# Patient Record
Sex: Female | Born: 1950 | Race: White | Hispanic: No | Marital: Married | Smoking: Former smoker
Health system: Southern US, Community
[De-identification: ages and names within clinical notes are randomized; demographics above are authoritative.]

## PROBLEM LIST (undated history)

## (undated) DIAGNOSIS — T7840XA Allergy, unspecified, initial encounter: Secondary | ICD-10-CM

## (undated) DIAGNOSIS — M8580 Other specified disorders of bone density and structure, unspecified site: Secondary | ICD-10-CM

## (undated) DIAGNOSIS — H269 Unspecified cataract: Secondary | ICD-10-CM

## (undated) DIAGNOSIS — M810 Age-related osteoporosis without current pathological fracture: Secondary | ICD-10-CM

## (undated) DIAGNOSIS — E785 Hyperlipidemia, unspecified: Secondary | ICD-10-CM

## (undated) HISTORY — PX: CATARACT EXTRACTION: SUR2

## (undated) HISTORY — DX: Unspecified cataract: H26.9

## (undated) HISTORY — PX: ABDOMINAL HYSTERECTOMY: SHX81

## (undated) HISTORY — DX: Age-related osteoporosis without current pathological fracture: M81.0

## (undated) HISTORY — DX: Other specified disorders of bone density and structure, unspecified site: M85.80

## (undated) HISTORY — DX: Hyperlipidemia, unspecified: E78.5

## (undated) HISTORY — PX: COLONOSCOPY: SHX174

## (undated) HISTORY — DX: Allergy, unspecified, initial encounter: T78.40XA

## (undated) HISTORY — PX: CARPAL TUNNEL RELEASE: SHX101

---

## 1997-05-23 HISTORY — PX: UTERINE FIBROID SURGERY: SHX826

## 1997-12-24 ENCOUNTER — Encounter: Admission: RE | Admit: 1997-12-24 | Discharge: 1997-12-24 | Payer: Self-pay | Admitting: *Deleted

## 1998-04-13 ENCOUNTER — Ambulatory Visit (HOSPITAL_COMMUNITY): Admission: RE | Admit: 1998-04-13 | Discharge: 1998-04-13 | Payer: Self-pay | Admitting: *Deleted

## 1998-05-29 ENCOUNTER — Other Ambulatory Visit: Admission: RE | Admit: 1998-05-29 | Discharge: 1998-05-29 | Payer: Self-pay | Admitting: *Deleted

## 1998-06-08 ENCOUNTER — Inpatient Hospital Stay (HOSPITAL_COMMUNITY): Admission: RE | Admit: 1998-06-08 | Discharge: 1998-06-11 | Payer: Self-pay | Admitting: *Deleted

## 1999-08-09 ENCOUNTER — Encounter: Admission: RE | Admit: 1999-08-09 | Discharge: 1999-08-19 | Payer: Self-pay | Admitting: *Deleted

## 2001-03-27 ENCOUNTER — Ambulatory Visit (HOSPITAL_COMMUNITY): Admission: RE | Admit: 2001-03-27 | Discharge: 2001-03-27 | Payer: Self-pay | Admitting: *Deleted

## 2001-05-17 ENCOUNTER — Other Ambulatory Visit: Admission: RE | Admit: 2001-05-17 | Discharge: 2001-05-17 | Payer: Self-pay | Admitting: *Deleted

## 2001-05-21 ENCOUNTER — Encounter: Admission: RE | Admit: 2001-05-21 | Discharge: 2001-05-21 | Payer: Self-pay | Admitting: *Deleted

## 2001-05-21 ENCOUNTER — Encounter (INDEPENDENT_AMBULATORY_CARE_PROVIDER_SITE_OTHER): Payer: Self-pay | Admitting: *Deleted

## 2001-07-30 ENCOUNTER — Encounter: Admission: RE | Admit: 2001-07-30 | Discharge: 2001-07-30 | Payer: Self-pay | Admitting: *Deleted

## 2001-10-30 ENCOUNTER — Encounter: Admission: RE | Admit: 2001-10-30 | Discharge: 2001-10-30 | Payer: Self-pay | Admitting: *Deleted

## 2002-05-20 ENCOUNTER — Other Ambulatory Visit: Admission: RE | Admit: 2002-05-20 | Discharge: 2002-05-20 | Payer: Self-pay | Admitting: *Deleted

## 2002-08-27 ENCOUNTER — Encounter: Admission: RE | Admit: 2002-08-27 | Discharge: 2002-08-27 | Payer: Self-pay | Admitting: *Deleted

## 2002-12-26 ENCOUNTER — Ambulatory Visit (HOSPITAL_BASED_OUTPATIENT_CLINIC_OR_DEPARTMENT_OTHER): Admission: RE | Admit: 2002-12-26 | Discharge: 2002-12-26 | Payer: Self-pay | Admitting: Orthopedic Surgery

## 2003-09-16 ENCOUNTER — Encounter: Admission: RE | Admit: 2003-09-16 | Discharge: 2003-09-16 | Payer: Self-pay | Admitting: *Deleted

## 2004-09-23 ENCOUNTER — Encounter: Admission: RE | Admit: 2004-09-23 | Discharge: 2004-09-23 | Payer: Self-pay | Admitting: *Deleted

## 2005-09-27 ENCOUNTER — Encounter: Admission: RE | Admit: 2005-09-27 | Discharge: 2005-09-27 | Payer: Self-pay | Admitting: *Deleted

## 2006-10-24 ENCOUNTER — Encounter: Admission: RE | Admit: 2006-10-24 | Discharge: 2006-10-24 | Payer: Self-pay | Admitting: Physician Assistant

## 2007-10-31 ENCOUNTER — Encounter: Admission: RE | Admit: 2007-10-31 | Discharge: 2007-10-31 | Payer: Self-pay | Admitting: Family Medicine

## 2008-10-31 ENCOUNTER — Encounter: Admission: RE | Admit: 2008-10-31 | Discharge: 2008-10-31 | Payer: Self-pay | Admitting: Family Medicine

## 2009-04-15 ENCOUNTER — Emergency Department (HOSPITAL_COMMUNITY): Admission: EM | Admit: 2009-04-15 | Discharge: 2009-04-15 | Payer: Self-pay | Admitting: Emergency Medicine

## 2009-11-06 ENCOUNTER — Encounter: Admission: RE | Admit: 2009-11-06 | Discharge: 2009-11-06 | Payer: Self-pay | Admitting: Family Medicine

## 2010-10-04 ENCOUNTER — Other Ambulatory Visit: Payer: Self-pay | Admitting: Family Medicine

## 2010-10-04 DIAGNOSIS — Z1231 Encounter for screening mammogram for malignant neoplasm of breast: Secondary | ICD-10-CM

## 2010-10-08 NOTE — Op Note (Signed)
NAME:  Evelyn [name], Evelyn [name] NO.:  [id number]   MEDICAL RECORD NO.:  [id number]                   PATIENT TYPE:  AMB   LOCATION:  DSC                                  FACILITY:  [facility]   PHYSICIAN:  Deidre Ala, M.D.                 DATE OF BIRTH:  Feb 23, 1951   DATE OF PROCEDURE:  12/26/2002  DATE OF DISCHARGE:                                 OPERATIVE REPORT   PREOPERATIVE DIAGNOSIS:  Stenosing tenosynovitis triggering A1 pulley left  thumb.   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   PROCEDURE:  Release trigger finger stenosing tenosynovitis A1 pulley left  thumb.   SURGEON:  Bradley Ferris, M.D.   ASSISTANT:  Madilyn Fireman, P.A.-C.   ANESTHESIA:  IV regional forearm.   CULTURES:  None.   DRAINS:  None.   ESTIMATED BLOOD LOSS:  Minimal.   TOURNIQUET TIME:  25 minutes.   PATHOLOGIC FINDINGS AND HISTORY:  [name] presented May 22, 2002, with a  trigger thumb.  We injected her with cortisone.  She came back in, it had  gotten better but was still popping and she wanted an additional injection  on June 12, 2002.  She came back July 05, 2002, the thumb was doing a  lot better and we said we would watch it along.  By November 04, 2002, the thumb  was back hurting, catching, in both flexion and extension, and she wanted it  fixed.  Classic physical findings were noted.  She had a large nodule within  the tendon.  At surgery today, the A1 pulley was tight.  There was a nodule  within the tendon.  We released the A1 pulley and excised part of it and the  tendon was gliding very smoothly throughout.  Great care was taken to  protect and preserve the neurovascular bundles.   PROCEDURE:  With adequate anesthesia obtained using IV regional technique on  the forearm, 1 gram Ancef was given IV prophylaxis.  The patient was placed  in a supine position.  The left hand was prepped from the fingertips to  the  tourniquet in a standard fashion.  After standard prepping and draping, a  transverse incision was made in the base of the thumb flexion crease.  The  incision was deepened sharply and hemostasis obtained using the Bovie  electrocoagulator.  Under loupe magnification, the neurovascular bundles  were retracted and I came down upon the tendon sheath at the A1 pulley.  A  longitudinal incision was made with the beaver blade and then completed with  scissors.  I then excised part of the A1 pulley on either side.  The tendon  was then seen to glide freely.  Irrigation was carried out and the wound was  closed with a running 4-0 nylon and a bulky sterile compressive thumb  dressing  with  Ace was placed and the patient was taken to the recovery room in  satisfactory condition to be discharged to outpatient routine, told to call  the office for recheck on Monday, given Vicodin for pain.  Laboratory data  within normal limits.                                               Deidre Ala, M.D.    VEP/MEDQ  D:  12/26/2002  T:  12/26/2002  Job:  [id number]

## 2010-11-08 ENCOUNTER — Ambulatory Visit: Payer: Self-pay

## 2010-11-15 ENCOUNTER — Ambulatory Visit
Admission: RE | Admit: 2010-11-15 | Discharge: 2010-11-15 | Disposition: A | Discharge status: Home / Self Care | Payer: 59 | Source: Ambulatory Visit | Attending: Family Medicine | Admitting: Family Medicine

## 2010-11-15 DIAGNOSIS — Z1231 Encounter for screening mammogram for malignant neoplasm of breast: Secondary | ICD-10-CM

## 2011-10-14 ENCOUNTER — Other Ambulatory Visit: Payer: Self-pay | Admitting: Family Medicine

## 2011-10-14 DIAGNOSIS — Z1231 Encounter for screening mammogram for malignant neoplasm of breast: Secondary | ICD-10-CM

## 2011-11-16 ENCOUNTER — Ambulatory Visit
Admission: RE | Admit: 2011-11-16 | Discharge: 2011-11-16 | Disposition: A | Discharge status: Home / Self Care | Payer: 59 | Source: Ambulatory Visit | Attending: Family Medicine | Admitting: Family Medicine

## 2011-11-16 DIAGNOSIS — Z1231 Encounter for screening mammogram for malignant neoplasm of breast: Secondary | ICD-10-CM

## 2012-07-17 ENCOUNTER — Encounter (HOSPITAL_COMMUNITY): Payer: Self-pay | Admitting: *Deleted

## 2012-07-17 ENCOUNTER — Emergency Department (HOSPITAL_COMMUNITY)
Admission: EM | Admit: 2012-07-17 | Discharge: 2012-07-17 | Disposition: A | Discharge status: Home / Self Care | Payer: PRIVATE HEALTH INSURANCE | Attending: Emergency Medicine | Admitting: Emergency Medicine

## 2012-07-17 DIAGNOSIS — S61209A Unspecified open wound of unspecified finger without damage to nail, initial encounter: Secondary | ICD-10-CM | POA: Insufficient documentation

## 2012-07-17 DIAGNOSIS — Z79899 Other long term (current) drug therapy: Secondary | ICD-10-CM | POA: Insufficient documentation

## 2012-07-17 DIAGNOSIS — W261XXA Contact with sword or dagger, initial encounter: Secondary | ICD-10-CM | POA: Insufficient documentation

## 2012-07-17 DIAGNOSIS — Y9389 Activity, other specified: Secondary | ICD-10-CM | POA: Insufficient documentation

## 2012-07-17 DIAGNOSIS — Y929 Unspecified place or not applicable: Secondary | ICD-10-CM | POA: Insufficient documentation

## 2012-07-17 DIAGNOSIS — W260XXA Contact with knife, initial encounter: Secondary | ICD-10-CM | POA: Insufficient documentation

## 2012-07-17 DIAGNOSIS — IMO0002 Reserved for concepts with insufficient information to code with codable children: Secondary | ICD-10-CM

## 2012-07-17 NOTE — ED Notes (Signed)
Pt reports cutting her L middle finger with a kitchen knife while she was at work today.  No active bleeding noted at this time.

## 2012-07-17 NOTE — ED Notes (Signed)
Please see triage note

## 2012-07-17 NOTE — Progress Notes (Signed)
Referral given to partnership for community care liaison when noted pt without pcp listed Pt is an employee with [employer] coverage and able to obtain a pcp if needed using customer service number or web site

## 2012-07-17 NOTE — ED Provider Notes (Signed)
History     CSN: [id number]  Arrival date & time 07/17/12  1206   First MD Initiated Contact with Patient 07/17/12 1222      Chief Complaint  Patient presents with  . Laceration    L midle finger    (Consider location/radiation/quality/duration/timing/severity/associated sxs/prior treatment) HPI Comments: Patient is a 62 year old female who presents with a laceration that occurred today while she was at work today. She reports using a kitchen knife to cut vegetables when she accidentally cut her left middle finger. Patient reports immediate onset of burning pain and bleeding. Patient applied pressure to the wound to reduce bleeding. No other associated symptoms. No aggravating/alleviating factors.    History reviewed. No pertinent past medical history.  Past Surgical History  Procedure Laterality Date  . Abdominal hysterectomy      No family history on file.  History  Substance Use Topics  . Smoking status: Never Smoker   . Smokeless tobacco: Not on file  . Alcohol Use: Yes     Comment: occa    OB History   Grav Para Term Preterm Abortions TAB SAB Ect Mult Living                  Review of Systems  Skin: Positive for wound.  All other systems reviewed and are negative.    Allergies  Review of patient's allergies indicates no known allergies.  Home Medications   Current Outpatient Rx  Name  Route  Sig  Dispense  Refill  . Calcium Carbonate-Vitamin D (OS-CAL 500 + D PO)   Oral   Take 1 tablet by mouth daily.         Marland Kitchen ibuprofen (ADVIL,MOTRIN) 200 MG tablet   Oral   Take 200 mg by mouth every 6 (six) hours as needed for pain (pain).           BP 140/67  Pulse 79  Temp(Src) 98.3 F (36.8 C) (Oral)  Resp 20  SpO2 98%  Physical Exam  Nursing note and vitals reviewed. Constitutional: She appears well-developed and well-nourished. No distress.  HENT:  Head: Normocephalic and atraumatic.  Eyes: Conjunctivae are normal.  Neck: Normal range of  motion.  Cardiovascular: Normal rate and regular rhythm.  Exam reveals no gallop and no friction rub.   No murmur heard. Pulmonary/Chest: Effort normal and breath sounds normal. She has no wheezes. She has no rales. She exhibits no tenderness.  Abdominal: Soft. There is no tenderness.  Musculoskeletal: Normal range of motion.  Neurological: She is alert.  Speech is goal-oriented. Moves limbs without ataxia.   Skin: Skin is warm and dry.  1 cm horizontal laceration on distal left middle finger. No active bleeding.   Psychiatric: She has a normal mood and affect. Her behavior is normal.    ED Course  Procedures (including critical care time)  LACERATION REPAIR Performed by: Emilia Beck Authorized by: Emilia Beck Consent: Verbal consent obtained. Risks and benefits: risks, benefits and alternatives were discussed Consent given by: patient Patient identity confirmed: provided demographic data Prepped and Draped in normal sterile fashion Wound explored  Laceration Location: distal left middle finger  Laceration Length: 1 cm  No Foreign Bodies seen or palpated  Anesthesia:none  Anesthetic total: 0 ml  Irrigation method: syringe Amount of cleaning: standard  Skin closure: steri strips  Number of sutures: n/a  Technique: n/a  Patient tolerance: Patient tolerated the procedure well with no immediate complications.   Labs Reviewed - No data  to display No results found.   1. Laceration       MDM  1:00 PM Patient will have steri strips applied to laceration.   1:05 PM Laceration repaired without complication. Patient will be discharged without further evaluation. Patient instructed to return with worsening or concerning symptoms.         Emilia Beck, [place] 07/23/12 2108

## 2012-07-18 ENCOUNTER — Encounter (HOSPITAL_COMMUNITY): Payer: Self-pay | Admitting: Certified Registered Nurse Anesthetist

## 2012-07-18 ENCOUNTER — Other Ambulatory Visit: Payer: Self-pay | Admitting: Orthopedic Surgery

## 2012-07-18 SURGERY — OPEN REDUCTION INTERNAL FIXATION (ORIF) ANKLE FRACTURE
Anesthesia: General | Laterality: Right

## 2012-07-18 NOTE — H&P (Signed)
Allice Garro is an 62 y.o. female.   Chief Complaint: PAINFUL DEFORMED LEFT ANKLE HPI: THIS IS A 61 Y/O FEMALE WHO FELL COMING DOWN SOME STEPS AT HOME AND FELL INJURING THE LEFT ANKLE. NO LOC.=, OR OTHER INJURIES.  No past medical history on file.  Past Surgical History  Procedure Laterality Date  . Abdominal hysterectomy    RIGHT ANKLE ORIF HISTORY OF BREAST CANCER No family history on file. Social History:  reports that she has never smoked. She does not have any smokeless tobacco history on file. She reports that  drinks alcohol. Her drug history is not on file.  Allergies: No Known Allergies PENICILLIN  (Not in a hospital admission)  No results found for this or any previous visit (from the past 48 hour(s)). No results found.  Review of Systems  Constitutional: Negative.   HENT: Negative.   Eyes: Negative.   Respiratory: Negative.   Cardiovascular: Negative.   Gastrointestinal: Negative.   Genitourinary: Negative.   Musculoskeletal: Positive for joint pain and falls.  Skin: Negative.   Neurological: Negative.   Endo/Heme/Allergies: Negative.   Psychiatric/Behavioral: Negative.     There were no vitals taken for this visit. Physical Exam LEFT  ANKLE WITH DEFORMITY OF SUBLUXATION , TENTING OF THE SKIN WITH POOR BLANCHING OF THE SKIN WITH THE BONE PRESSED AGAINST THE SKIN. X-R REVEALS FRACTURE DISLOCATION LEFT ANKLE ,BIMALLEOLAR. Assessment/Plan FRACTURE DISLOCATION LEFT ANKLE ,BIMALLEOLAR Roosvelt Harps ORIF LEFT ANKLE  Raybon Conard F 07/18/2012, 12:46 PM

## 2012-07-18 NOTE — Anesthesia Preprocedure Evaluation (Deleted)
Anesthesia Evaluation  Patient identified by MRN, date of birth, ID band Patient awake    Reviewed: Allergy & Precautions, H&P , NPO status , Patient's Chart, lab work & pertinent test results  Airway       Dental   Pulmonary neg pulmonary ROS,          Cardiovascular negative cardio ROS      Neuro/Psych negative neurological ROS  negative psych ROS   GI/Hepatic negative GI ROS, Neg liver ROS,   Endo/Other  negative endocrine ROS  Renal/GU negative Renal ROS  negative genitourinary   Musculoskeletal negative musculoskeletal ROS (+)   Abdominal   Peds  Hematology negative hematology ROS (+)   Anesthesia Other Findings   Reproductive/Obstetrics negative OB ROS                           Anesthesia Physical Anesthesia Plan  ASA: I  Anesthesia Plan: General   Post-op Pain Management:    Induction: Intravenous  Airway Management Planned: Oral ETT  Additional Equipment:   Intra-op Plan:   Post-operative Plan: Extubation in OR  Informed Consent: I have reviewed the patients History and Physical, chart, labs and discussed the procedure including the risks, benefits and alternatives for the proposed anesthesia with the patient or authorized representative who has indicated his/her understanding and acceptance.   Dental advisory given  Plan Discussed with: CRNA  Anesthesia Plan Comments:         Anesthesia Quick Evaluation

## 2012-07-19 ENCOUNTER — Inpatient Hospital Stay: Admit: 2012-07-19 | Payer: Self-pay | Admitting: Orthopedic Surgery

## 2012-07-19 NOTE — H&P (Cosign Needed)
NAMEMarland Kitchen  Evelyn [name], Evelyn [name] NO.:  [id number]  MEDICAL RECORD NO.:  [id number]  LOCATION:  WTR8                         FACILITY:  [facility]  PHYSICIAN:  Myrtie Neither, MD      DATE OF BIRTH:  March 02, 1951  DATE OF ADMISSION:  07/17/2012 DATE OF DISCHARGE:  07/17/2012                             HISTORY & PHYSICAL   CHIEF COMPLAINT:  Painful deformed right ankle.  HISTORY OF PRESENT ILLNESS:  This is a 62 year old female who this morning was coming down some stairs at her home and missed a step resulting in twisting her right ankle and falling.  The patient denies any loss of consciousness or any other injury and was brought to [facility] Emergency Room for treatment.  The patient complained of severe pain and deformity of the right ankle.  PAST MEDICAL HISTORY:  Right ankle ORIF, abdominal hysterectomy, breast cancer, and hypertension.  ALLERGIES:  PENICILLIN.  REVIEW OF SYSTEMS:  No cardiac, no respiratory, no urinary or bowel symptoms.  SOCIAL HISTORY:  Occasional use of alcohol.  Denies any use of illegal drugs or tobacco.  FAMILY HISTORY:  Hypertension.  PHYSICAL EXAMINATION:  GENERAL:  Alert and oriented, no acute distress, lying in supine position. VITAL SIGNS:  Blood pressure 120/70, pulse 82, respirations 18, temperature 98.6. HEAD:  Normocephalic. EYES:  Conjunctivae clear. NECK:  Supple. CHEST:  Clear. CARDIAC:  S1, S2 regular. EXTREMITIES:  Left ankle grossly deformity, deformed with subluxation of the ankle with the skin over medial malleolus, they intended with some mild ecchymosis and decreased blanching of the skin itself.  Dorsalis pedis was palpable.  X-ray Revealed bimalleolar fracture and dislocation of the left ankle.  IMPRESSION:  Bimalleolar fracture and dislocation of left ankle.  The patient while in supine position, the left knee was taken into a flexed position and with gentle manipulation, the ankle itself was reduced relieving  tension and stress off of the skin allowing skin except the blench well.  Dorsalis pedis remained intact.  Toe splinting was used to stabilize it until surgery.  PLAN:  ORIF, left ankle.     Myrtie Neither, MD     AC/MEDQ  D:  07/18/2012  T:  07/19/2012  Job:  [id number]

## 2012-07-23 NOTE — Discharge Summary (Cosign Needed)
NAMEMarland Kitchen  Evelyn, [name] NO.:  [id number]  MEDICAL RECORD NO.:  [id number]  LOCATION:  WTR8                         FACILITY:  [facility]  PHYSICIAN:  Myrtie Neither, MD      DATE OF BIRTH:  Sep 13, 1950  DATE OF ADMISSION:  07/17/2012 DATE OF DISCHARGE:  07/17/2012                              DISCHARGE SUMMARY   ADMITTING DIAGNOSIS:  Fracture dislocation, left ankle.  DISCHARGE DIAGNOSIS:  Fracture dislocation, left ankle.  COMPLICATIONS:  None.  INFECTIONS:  None.  OPERATION:  ORIF of the left ankle.  PERTINENT HISTORY:  This is a 62 year old female who had slipped and fell at home, coming own some stairways, and twisted her left ankle. The patient was brought to [facility] Emergency Room for treatment.  Pertinent physical was that of the left ankle grossly deformed, tendon swollen, bone pressed up against the skin with pale blanching.  X-ray revealed bimalleolar fracture with dislocation of the joint.  HOSPITAL COURSE:  The patient with preop laboratory, CBC, EKG, chest x- ray, __________, UA. The patient's lab was stable enough to undergo surgery.  The patient underwent ORIF of the left ankle.  Postoperative course was started on, had both pre and postop IV antibiotics, ice packs, elevation, physical therapy, nonweightbearing on the left side. The patient progressed quite well with pain being brought under control with Toradol 50 mg q.4 h. p.r.n., ice packs, elevation.  The patient was stable enough to be discharged and discharged home on Coumadin 5 mg. Home health nurse for INR checks and dressing changes.  Colace 100 mg b.i.d.  The patient is to resume her other medications.  The patient is to return to office in 1 week.  The patient discharged in stable and satisfactory condition.     Myrtie Neither, MD     AC/MEDQ  D:  07/22/2012  T:  07/22/2012  Job:  [id number]

## 2012-07-25 NOTE — ED Provider Notes (Signed)
Medical screening examination/treatment/procedure(s) were performed by non-physician practitioner and as supervising physician I was immediately available for consultation/collaboration. Devoria Albe, MD, FACEP   Ward Givens, MD 07/25/12 [phone]

## 2012-10-18 ENCOUNTER — Other Ambulatory Visit: Payer: Self-pay

## 2012-10-18 DIAGNOSIS — Z1231 Encounter for screening mammogram for malignant neoplasm of breast: Secondary | ICD-10-CM

## 2012-11-19 ENCOUNTER — Ambulatory Visit
Admission: RE | Admit: 2012-11-19 | Discharge: 2012-11-19 | Disposition: A | Discharge status: Home / Self Care | Payer: 59 | Source: Ambulatory Visit

## 2012-11-19 DIAGNOSIS — Z1231 Encounter for screening mammogram for malignant neoplasm of breast: Secondary | ICD-10-CM

## 2013-09-18 ENCOUNTER — Other Ambulatory Visit: Payer: Self-pay | Admitting: Family Medicine

## 2013-09-18 DIAGNOSIS — N951 Menopausal and female climacteric states: Secondary | ICD-10-CM

## 2013-09-20 ENCOUNTER — Ambulatory Visit
Admission: RE | Admit: 2013-09-20 | Discharge: 2013-09-20 | Disposition: A | Discharge status: Home / Self Care | Payer: 59 | Source: Ambulatory Visit | Attending: Family Medicine | Admitting: Family Medicine

## 2013-09-20 DIAGNOSIS — N951 Menopausal and female climacteric states: Secondary | ICD-10-CM

## 2013-10-23 ENCOUNTER — Other Ambulatory Visit: Payer: Self-pay

## 2013-10-23 DIAGNOSIS — Z1231 Encounter for screening mammogram for malignant neoplasm of breast: Secondary | ICD-10-CM

## 2013-11-20 ENCOUNTER — Ambulatory Visit
Admission: RE | Admit: 2013-11-20 | Discharge: 2013-11-20 | Disposition: A | Discharge status: Home / Self Care | Payer: 59 | Source: Ambulatory Visit

## 2013-11-20 DIAGNOSIS — Z1231 Encounter for screening mammogram for malignant neoplasm of breast: Secondary | ICD-10-CM

## 2014-11-27 ENCOUNTER — Other Ambulatory Visit: Payer: Self-pay

## 2014-11-27 DIAGNOSIS — Z1231 Encounter for screening mammogram for malignant neoplasm of breast: Secondary | ICD-10-CM

## 2014-12-01 ENCOUNTER — Ambulatory Visit
Admission: RE | Admit: 2014-12-01 | Discharge: 2014-12-01 | Disposition: A | Discharge status: Home / Self Care | Payer: 59 | Source: Ambulatory Visit

## 2014-12-01 DIAGNOSIS — Z1231 Encounter for screening mammogram for malignant neoplasm of breast: Secondary | ICD-10-CM

## 2015-04-08 ENCOUNTER — Encounter: Payer: Self-pay | Admitting: Gastroenterology

## 2015-05-21 ENCOUNTER — Ambulatory Visit (AMBULATORY_SURGERY_CENTER): Payer: Self-pay | Admitting: *Deleted

## 2015-05-21 VITALS — Ht 62.0 in | Wt 137.6 lb

## 2015-05-21 DIAGNOSIS — Z1211 Encounter for screening for malignant neoplasm of colon: Secondary | ICD-10-CM

## 2015-05-21 NOTE — Progress Notes (Signed)
No egg or soy allergy known to patient  No issues with past sedation with any surgeries  or procedures, no intubation problems  No diet pills per patient  No home 02 use per patient   Pt doesn't have computer for emmi video  [employer] employee- choose miralax due to cost

## 2015-06-03 ENCOUNTER — Encounter: Payer: Self-pay | Admitting: Gastroenterology

## 2015-06-03 ENCOUNTER — Ambulatory Visit (AMBULATORY_SURGERY_CENTER): Payer: 59 | Admitting: Gastroenterology

## 2015-06-03 VITALS — BP 136/68 | HR 59 | Temp 95.5°F | Resp 16 | Ht 62.0 in | Wt 137.0 lb

## 2015-06-03 DIAGNOSIS — D128 Benign neoplasm of rectum: Secondary | ICD-10-CM

## 2015-06-03 DIAGNOSIS — D129 Benign neoplasm of anus and anal canal: Secondary | ICD-10-CM

## 2015-06-03 DIAGNOSIS — D122 Benign neoplasm of ascending colon: Secondary | ICD-10-CM | POA: Diagnosis not present

## 2015-06-03 DIAGNOSIS — Z1211 Encounter for screening for malignant neoplasm of colon: Secondary | ICD-10-CM

## 2015-06-03 MED ORDER — SODIUM CHLORIDE 0.9 % IV SOLN: 500.0000 mL | INTRAVENOUS | Status: DC

## 2015-06-03 NOTE — Op Note (Signed)
[place]  [address] [place], [id number]   COLONOSCOPY PROCEDURE REPORT  PATIENT: Evelyn [name], Evelyn [name]  MR#: [id number] BIRTHDATE: [birth year] , 46  yrs. old GENDER: female ENDOSCOPIST: Ladene Artist, MD, Marval Regal REFERRED BY:  Maury Dus, M.D. PROCEDURE DATE:  06/03/2015 PROCEDURE:   Colonoscopy, screening and Colonoscopy with snare polypectomy First Screening Colonoscopy - Avg.  risk and is 50 yrs.  old or older Yes.  Prior Negative Screening - Now for repeat screening. N/A  History of Adenoma - Now for follow-up colonoscopy & has been > or = to 3 yrs.  N/A  Polyps removed today? Yes ASA CLASS:   Class II INDICATIONS:Screening for colonic neoplasia and Colorectal Neoplasm Risk Assessment for this procedure is average risk. MEDICATIONS: Monitored anesthesia care and Propofol 250 mg IV DESCRIPTION OF PROCEDURE:   After the risks benefits and alternatives of the procedure were thoroughly explained, informed consent was obtained.  The digital rectal exam revealed no abnormalities of the rectum.   The LB [id number]  endoscope was introduced through the anus and advanced to the cecum, which was identified by both the appendix and ileocecal valve. No adverse events experienced.   The quality of the prep was good.  (Suprep was used)  The instrument was then slowly withdrawn as the colon was fully examined. Estimated blood loss is zero unless otherwise noted in this procedure report.    COLON FINDINGS: Three sessile polyps measuring 5 mm in size were found in the rectum and ascending colon.  Polypectomies were performed with a cold snare.  The resection was complete, the polyp tissue was completely retrieved and sent to histology.   Melanosis coli was found throughout the entire examined colon.   The examination was otherwise normal.  Retroflexed views revealed internal Grade I hemorrhoids. The time to cecum = 2.9 Withdrawal time = 11.1   The scope was withdrawn  and the procedure completed. COMPLICATIONS: There were no immediate complications.  ENDOSCOPIC IMPRESSION: 1.   Three sessile polyps in the rectum and ascending colon; polypectomies performed with a cold snare 2.   Melanosis coli was found throughout the entire examined colon 3.   Grade l internal hemorrhoids  RECOMMENDATIONS: 1.  Await pathology results 2.  Repeat colonoscopy in 5 years if polyp(s) adenomatous; otherwise 10 years  eSigned:  Ladene Artist, MD, [facility] 06/03/2015 12:09 PM

## 2015-06-03 NOTE — Progress Notes (Signed)
Called to room to assist during endoscopic procedure.  Patient ID and intended procedure confirmed with present staff. Received instructions for my participation in the procedure from the performing physician.  

## 2015-06-03 NOTE — Patient Instructions (Signed)
YOU HAD AN ENDOSCOPIC PROCEDURE TODAY AT THE [facility]:   Refer to the procedure report that was given to you for any specific questions about what was found during the examination.  If the procedure report does not answer your questions, please call your gastroenterologist to clarify.  If you requested that your care partner not be given the details of your procedure findings, then the procedure report has been included in a sealed envelope for you to review at your convenience later.  YOU SHOULD EXPECT: Some feelings of bloating in the abdomen. Passage of more gas than usual.  Walking can help get rid of the air that was put into your GI tract during the procedure and reduce the bloating. If you had a lower endoscopy (such as a colonoscopy or flexible sigmoidoscopy) you may notice spotting of blood in your stool or on the toilet paper. If you underwent a bowel prep for your procedure, you may not have a normal bowel movement for a few days.  Please Note:  You might notice some irritation and congestion in your nose or some drainage.  This is from the oxygen used during your procedure.  There is no need for concern and it should clear up in a day or so.  SYMPTOMS TO REPORT IMMEDIATELY:   Following lower endoscopy (colonoscopy or flexible sigmoidoscopy):  Excessive amounts of blood in the stool  Significant tenderness or worsening of abdominal pains  Swelling of the abdomen that is new, acute  Fever of 100F or higher    For urgent or emergent issues, a gastroenterologist can be reached at any hour by calling [phone].   DIET: Your first meal following the procedure should be a small meal and then it is ok to progress to your normal diet. Heavy or fried foods are harder to digest and may make you feel nauseous or bloated.  Likewise, meals heavy in dairy and vegetables can increase bloating.  Drink plenty of fluids but you should avoid alcoholic beverages for 24  hours.  ACTIVITY:  You should plan to take it easy for the rest of today and you should NOT DRIVE or use heavy machinery until tomorrow (because of the sedation medicines used during the test).    FOLLOW UP: Our staff will call the number listed on your records the next business day following your procedure to check on you and address any questions or concerns that you may have regarding the information given to you following your procedure. If we do not reach you, we will leave a message.  However, if you are feeling well and you are not experiencing any problems, there is no need to return our call.  We will assume that you have returned to your regular daily activities without incident.  If any biopsies were taken you will be contacted by phone or by letter within the next 1-3 weeks.  Please call us at [phone] if you have not heard about the biopsies in 3 weeks.    SIGNATURES/CONFIDENTIALITY: You and/or your care partner have signed paperwork which will be entered into your electronic medical record.  These signatures attest to the fact that that the information above on your After Visit Summary has been reviewed and is understood.  Full responsibility of the confidentiality of this discharge information lies with you and/or your care-partner.  Resume medications. Information given on polyps and hemorrhoids. 

## 2015-06-03 NOTE — Progress Notes (Signed)
Patient denies any allergies to eggs or soy. 

## 2015-06-03 NOTE — Progress Notes (Signed)
Patient awakening,vss,report to rn 

## 2015-06-04 ENCOUNTER — Telehealth: Payer: Self-pay | Admitting: *Deleted

## 2015-06-04 NOTE — Telephone Encounter (Signed)
  Follow up Call-  Call back number 06/03/2015  Post procedure Call Back phone  # [phone] speak with husband-James  Permission to leave phone message Yes     Patient questions:  Do you have a fever, pain , or abdominal swelling? No. Pain Score  0 *  Have you tolerated food without any problems? Yes.    Have you been able to return to your normal activities? Yes.    Do you have any questions about your discharge instructions: Diet   No. Medications  No. Follow up visit  No.  Do you have questions or concerns about your Care? No.  Actions: * If pain score is 4 or above: No action needed, pain <4.  Spoke with husband pt. Gone to work.

## 2015-06-09 ENCOUNTER — Encounter: Payer: Self-pay | Admitting: Gastroenterology

## 2015-11-26 ENCOUNTER — Other Ambulatory Visit: Payer: Self-pay | Admitting: Family Medicine

## 2015-11-26 DIAGNOSIS — Z1231 Encounter for screening mammogram for malignant neoplasm of breast: Secondary | ICD-10-CM

## 2015-12-07 ENCOUNTER — Ambulatory Visit
Admission: RE | Admit: 2015-12-07 | Discharge: 2015-12-07 | Disposition: A | Discharge status: Home / Self Care | Payer: 59 | Source: Ambulatory Visit | Attending: Family Medicine | Admitting: Family Medicine

## 2015-12-07 DIAGNOSIS — Z1231 Encounter for screening mammogram for malignant neoplasm of breast: Secondary | ICD-10-CM | POA: Diagnosis not present

## 2016-04-28 DIAGNOSIS — J029 Acute pharyngitis, unspecified: Secondary | ICD-10-CM | POA: Diagnosis not present

## 2016-04-28 DIAGNOSIS — R05 Cough: Secondary | ICD-10-CM | POA: Diagnosis not present

## 2016-06-29 DIAGNOSIS — Z1159 Encounter for screening for other viral diseases: Secondary | ICD-10-CM | POA: Diagnosis not present

## 2016-06-29 DIAGNOSIS — E2839 Other primary ovarian failure: Secondary | ICD-10-CM | POA: Diagnosis not present

## 2016-06-29 DIAGNOSIS — Z0000 Encounter for general adult medical examination without abnormal findings: Secondary | ICD-10-CM | POA: Diagnosis not present

## 2016-06-29 DIAGNOSIS — M5432 Sciatica, left side: Secondary | ICD-10-CM | POA: Diagnosis not present

## 2016-06-29 DIAGNOSIS — M8580 Other specified disorders of bone density and structure, unspecified site: Secondary | ICD-10-CM | POA: Diagnosis not present

## 2016-06-29 DIAGNOSIS — E7800 Pure hypercholesterolemia, unspecified: Secondary | ICD-10-CM | POA: Diagnosis not present

## 2016-07-01 ENCOUNTER — Other Ambulatory Visit: Payer: Self-pay | Admitting: Family Medicine

## 2016-07-01 DIAGNOSIS — E2839 Other primary ovarian failure: Secondary | ICD-10-CM

## 2016-07-07 ENCOUNTER — Ambulatory Visit
Admission: RE | Admit: 2016-07-07 | Discharge: 2016-07-07 | Disposition: A | Discharge status: Home / Self Care | Payer: 59 | Source: Ambulatory Visit | Attending: Family Medicine | Admitting: Family Medicine

## 2016-07-07 DIAGNOSIS — E2839 Other primary ovarian failure: Secondary | ICD-10-CM

## 2016-07-07 DIAGNOSIS — Z780 Asymptomatic menopausal state: Secondary | ICD-10-CM | POA: Diagnosis not present

## 2016-07-07 DIAGNOSIS — M810 Age-related osteoporosis without current pathological fracture: Secondary | ICD-10-CM | POA: Diagnosis not present

## 2016-11-01 ENCOUNTER — Other Ambulatory Visit: Payer: Self-pay | Admitting: Family Medicine

## 2016-11-01 DIAGNOSIS — Z1231 Encounter for screening mammogram for malignant neoplasm of breast: Secondary | ICD-10-CM

## 2016-12-07 ENCOUNTER — Ambulatory Visit
Admission: RE | Admit: 2016-12-07 | Discharge: 2016-12-07 | Disposition: A | Discharge status: Home / Self Care | Payer: 59 | Source: Ambulatory Visit | Attending: Family Medicine | Admitting: Family Medicine

## 2016-12-07 DIAGNOSIS — Z1231 Encounter for screening mammogram for malignant neoplasm of breast: Secondary | ICD-10-CM | POA: Diagnosis not present

## 2017-01-24 DIAGNOSIS — J069 Acute upper respiratory infection, unspecified: Secondary | ICD-10-CM | POA: Diagnosis not present

## 2017-01-24 DIAGNOSIS — B9789 Other viral agents as the cause of diseases classified elsewhere: Secondary | ICD-10-CM | POA: Diagnosis not present

## 2017-07-04 DIAGNOSIS — M5432 Sciatica, left side: Secondary | ICD-10-CM | POA: Diagnosis not present

## 2017-07-04 DIAGNOSIS — E7800 Pure hypercholesterolemia, unspecified: Secondary | ICD-10-CM | POA: Diagnosis not present

## 2017-07-04 DIAGNOSIS — E2839 Other primary ovarian failure: Secondary | ICD-10-CM | POA: Diagnosis not present

## 2017-07-04 DIAGNOSIS — M810 Age-related osteoporosis without current pathological fracture: Secondary | ICD-10-CM | POA: Diagnosis not present

## 2017-07-04 DIAGNOSIS — Z0000 Encounter for general adult medical examination without abnormal findings: Secondary | ICD-10-CM | POA: Diagnosis not present

## 2017-09-20 DIAGNOSIS — J9801 Acute bronchospasm: Secondary | ICD-10-CM | POA: Diagnosis not present

## 2017-09-20 DIAGNOSIS — J309 Allergic rhinitis, unspecified: Secondary | ICD-10-CM | POA: Diagnosis not present

## 2017-09-20 DIAGNOSIS — J069 Acute upper respiratory infection, unspecified: Secondary | ICD-10-CM | POA: Diagnosis not present

## 2017-11-21 ENCOUNTER — Other Ambulatory Visit: Payer: Self-pay | Admitting: Family Medicine

## 2017-11-21 DIAGNOSIS — Z1231 Encounter for screening mammogram for malignant neoplasm of breast: Secondary | ICD-10-CM

## 2017-12-18 ENCOUNTER — Ambulatory Visit
Admission: RE | Admit: 2017-12-18 | Discharge: 2017-12-18 | Disposition: A | Discharge status: Home / Self Care | Payer: Medicare Other | Source: Ambulatory Visit | Attending: Family Medicine | Admitting: Family Medicine

## 2017-12-18 DIAGNOSIS — Z1231 Encounter for screening mammogram for malignant neoplasm of breast: Secondary | ICD-10-CM

## 2018-07-19 ENCOUNTER — Other Ambulatory Visit: Payer: Self-pay | Admitting: Family Medicine

## 2018-07-19 DIAGNOSIS — R0989 Other specified symptoms and signs involving the circulatory and respiratory systems: Secondary | ICD-10-CM

## 2018-07-23 ENCOUNTER — Other Ambulatory Visit: Payer: Self-pay | Admitting: Family Medicine

## 2018-07-23 DIAGNOSIS — M810 Age-related osteoporosis without current pathological fracture: Secondary | ICD-10-CM

## 2018-07-24 ENCOUNTER — Ambulatory Visit
Admission: RE | Admit: 2018-07-24 | Discharge: 2018-07-24 | Disposition: A | Discharge status: Home / Self Care | Payer: Medicare Other | Source: Ambulatory Visit | Attending: Family Medicine | Admitting: Family Medicine

## 2018-07-24 DIAGNOSIS — R0989 Other specified symptoms and signs involving the circulatory and respiratory systems: Secondary | ICD-10-CM

## 2018-08-02 ENCOUNTER — Other Ambulatory Visit: Payer: Self-pay | Admitting: Family Medicine

## 2018-08-02 DIAGNOSIS — E041 Nontoxic single thyroid nodule: Secondary | ICD-10-CM

## 2018-08-06 ENCOUNTER — Ambulatory Visit
Admission: RE | Admit: 2018-08-06 | Discharge: 2018-08-06 | Disposition: A | Discharge status: Home / Self Care | Payer: Medicare Other | Source: Ambulatory Visit | Attending: Family Medicine | Admitting: Family Medicine

## 2018-08-06 ENCOUNTER — Other Ambulatory Visit: Payer: Self-pay

## 2018-08-06 DIAGNOSIS — E041 Nontoxic single thyroid nodule: Secondary | ICD-10-CM

## 2018-08-08 ENCOUNTER — Other Ambulatory Visit: Payer: Self-pay | Admitting: Family Medicine

## 2018-08-08 DIAGNOSIS — Z1231 Encounter for screening mammogram for malignant neoplasm of breast: Secondary | ICD-10-CM

## 2018-08-15 ENCOUNTER — Other Ambulatory Visit: Payer: Self-pay | Admitting: Family Medicine

## 2018-08-15 DIAGNOSIS — E041 Nontoxic single thyroid nodule: Secondary | ICD-10-CM

## 2018-10-10 ENCOUNTER — Other Ambulatory Visit: Payer: Self-pay

## 2018-10-11 ENCOUNTER — Ambulatory Visit
Admission: RE | Admit: 2018-10-11 | Discharge: 2018-10-11 | Disposition: A | Discharge status: Home / Self Care | Payer: Medicare Other | Source: Ambulatory Visit | Attending: Family Medicine | Admitting: Family Medicine

## 2018-10-11 ENCOUNTER — Other Ambulatory Visit: Payer: Self-pay

## 2018-10-11 ENCOUNTER — Other Ambulatory Visit (HOSPITAL_COMMUNITY)
Admission: RE | Admit: 2018-10-11 | Discharge: 2018-10-11 | Disposition: A | Discharge status: Home / Self Care | Payer: Medicare Other | Source: Ambulatory Visit | Attending: Radiology | Admitting: Radiology

## 2018-10-11 DIAGNOSIS — E041 Nontoxic single thyroid nodule: Secondary | ICD-10-CM

## 2018-11-22 ENCOUNTER — Other Ambulatory Visit: Payer: Self-pay | Admitting: Surgery

## 2018-11-22 DIAGNOSIS — E041 Nontoxic single thyroid nodule: Secondary | ICD-10-CM

## 2018-12-04 ENCOUNTER — Other Ambulatory Visit: Payer: Self-pay

## 2018-12-04 ENCOUNTER — Ambulatory Visit
Admission: RE | Admit: 2018-12-04 | Discharge: 2018-12-04 | Disposition: A | Discharge status: Home / Self Care | Payer: Medicare Other | Source: Ambulatory Visit | Attending: Surgery | Admitting: Surgery

## 2018-12-04 DIAGNOSIS — E041 Nontoxic single thyroid nodule: Secondary | ICD-10-CM

## 2018-12-18 ENCOUNTER — Encounter (HOSPITAL_COMMUNITY): Payer: Self-pay

## 2018-12-20 ENCOUNTER — Ambulatory Visit
Admission: RE | Admit: 2018-12-20 | Discharge: 2018-12-20 | Disposition: A | Discharge status: Home / Self Care | Payer: Medicare Other | Source: Ambulatory Visit | Attending: Family Medicine | Admitting: Family Medicine

## 2018-12-20 ENCOUNTER — Other Ambulatory Visit: Payer: Self-pay

## 2018-12-20 DIAGNOSIS — Z1231 Encounter for screening mammogram for malignant neoplasm of breast: Secondary | ICD-10-CM

## 2018-12-20 DIAGNOSIS — M810 Age-related osteoporosis without current pathological fracture: Secondary | ICD-10-CM

## 2019-11-06 ENCOUNTER — Other Ambulatory Visit: Payer: Self-pay | Admitting: Surgery

## 2019-11-06 ENCOUNTER — Other Ambulatory Visit (HOSPITAL_COMMUNITY): Payer: Self-pay | Admitting: Surgery

## 2019-11-06 DIAGNOSIS — D440 Neoplasm of uncertain behavior of thyroid gland: Secondary | ICD-10-CM

## 2019-11-08 ENCOUNTER — Other Ambulatory Visit: Payer: Self-pay | Admitting: Family Medicine

## 2019-11-08 DIAGNOSIS — Z1231 Encounter for screening mammogram for malignant neoplasm of breast: Secondary | ICD-10-CM

## 2019-11-12 ENCOUNTER — Ambulatory Visit (HOSPITAL_COMMUNITY)
Admission: RE | Admit: 2019-11-12 | Discharge: 2019-11-12 | Disposition: A | Discharge status: Home / Self Care | Payer: Medicare Other | Source: Ambulatory Visit | Attending: Surgery | Admitting: Surgery

## 2019-11-12 ENCOUNTER — Other Ambulatory Visit: Payer: Self-pay

## 2019-11-12 DIAGNOSIS — D440 Neoplasm of uncertain behavior of thyroid gland: Secondary | ICD-10-CM | POA: Diagnosis not present

## 2019-12-24 ENCOUNTER — Other Ambulatory Visit: Payer: Self-pay

## 2019-12-24 ENCOUNTER — Ambulatory Visit
Admission: RE | Admit: 2019-12-24 | Discharge: 2019-12-24 | Disposition: A | Discharge status: Home / Self Care | Payer: Medicare Other | Source: Ambulatory Visit | Attending: Family Medicine | Admitting: Family Medicine

## 2019-12-24 DIAGNOSIS — Z1231 Encounter for screening mammogram for malignant neoplasm of breast: Secondary | ICD-10-CM

## 2020-04-04 ENCOUNTER — Ambulatory Visit: Payer: Medicare Other | Attending: Internal Medicine

## 2020-04-04 DIAGNOSIS — Z23 Encounter for immunization: Secondary | ICD-10-CM

## 2020-04-04 NOTE — Progress Notes (Signed)
   Covid-19 Vaccination Clinic  Name:  Evelyn [name]    MRN: [id number] DOB: 06-Apr-1951  04/04/2020  Evelyn [name] was observed post Covid-19 immunization for 15 minutes without incident. She was provided with Vaccine Information Sheet and instruction to access the V-Safe system.   Evelyn [name] was instructed to call 911 with any severe reactions post vaccine: Marland Kitchen Difficulty breathing  . Swelling of face and throat  . A fast heartbeat  . A bad rash all over body  . Dizziness and weakness

## 2020-06-30 ENCOUNTER — Encounter: Payer: Self-pay | Admitting: Gastroenterology

## 2020-07-14 ENCOUNTER — Encounter: Payer: Self-pay | Admitting: Gastroenterology

## 2020-08-05 DIAGNOSIS — E7800 Pure hypercholesterolemia, unspecified: Secondary | ICD-10-CM | POA: Diagnosis not present

## 2020-08-05 DIAGNOSIS — R0989 Other specified symptoms and signs involving the circulatory and respiratory systems: Secondary | ICD-10-CM | POA: Diagnosis not present

## 2020-08-05 DIAGNOSIS — Z0000 Encounter for general adult medical examination without abnormal findings: Secondary | ICD-10-CM | POA: Diagnosis not present

## 2020-08-05 DIAGNOSIS — Z1389 Encounter for screening for other disorder: Secondary | ICD-10-CM | POA: Diagnosis not present

## 2020-08-05 DIAGNOSIS — Z23 Encounter for immunization: Secondary | ICD-10-CM | POA: Diagnosis not present

## 2020-08-05 DIAGNOSIS — E041 Nontoxic single thyroid nodule: Secondary | ICD-10-CM | POA: Diagnosis not present

## 2020-08-05 DIAGNOSIS — M810 Age-related osteoporosis without current pathological fracture: Secondary | ICD-10-CM | POA: Diagnosis not present

## 2020-08-05 DIAGNOSIS — M5432 Sciatica, left side: Secondary | ICD-10-CM | POA: Diagnosis not present

## 2020-08-06 ENCOUNTER — Other Ambulatory Visit: Payer: Self-pay | Admitting: Family Medicine

## 2020-08-06 DIAGNOSIS — M810 Age-related osteoporosis without current pathological fracture: Secondary | ICD-10-CM

## 2020-09-02 ENCOUNTER — Other Ambulatory Visit: Payer: Self-pay

## 2020-09-02 ENCOUNTER — Ambulatory Visit (AMBULATORY_SURGERY_CENTER): Payer: Self-pay | Admitting: *Deleted

## 2020-09-02 VITALS — Ht 61.0 in | Wt 131.0 lb

## 2020-09-02 DIAGNOSIS — Z86010 Personal history of colonic polyps: Secondary | ICD-10-CM

## 2020-09-02 MED ORDER — NA SULFATE-K SULFATE-MG SULF 17.5-3.13-1.6 GM/177ML PO SOLN: 1.0000 | Freq: Once | ORAL | 0 refills | Status: AC

## 2020-09-02 NOTE — Progress Notes (Signed)

## 2020-09-11 ENCOUNTER — Encounter: Payer: Self-pay | Admitting: Gastroenterology

## 2020-09-16 ENCOUNTER — Ambulatory Visit (AMBULATORY_SURGERY_CENTER): Payer: Medicare Other | Admitting: Gastroenterology

## 2020-09-16 ENCOUNTER — Other Ambulatory Visit: Payer: Self-pay | Admitting: Gastroenterology

## 2020-09-16 ENCOUNTER — Encounter: Payer: Self-pay | Admitting: Gastroenterology

## 2020-09-16 ENCOUNTER — Other Ambulatory Visit: Payer: Self-pay

## 2020-09-16 VITALS — BP 135/77 | HR 70 | Temp 97.6°F | Resp 24 | Ht 62.0 in | Wt 131.0 lb

## 2020-09-16 DIAGNOSIS — D122 Benign neoplasm of ascending colon: Secondary | ICD-10-CM

## 2020-09-16 DIAGNOSIS — D120 Benign neoplasm of cecum: Secondary | ICD-10-CM

## 2020-09-16 DIAGNOSIS — Z86010 Personal history of colonic polyps: Secondary | ICD-10-CM

## 2020-09-16 MED ORDER — SODIUM CHLORIDE 0.9 % IV SOLN: 500.0000 mL | Freq: Once | INTRAVENOUS | Status: DC

## 2020-09-16 NOTE — Progress Notes (Signed)
Vitals-CW  Pt's states no medical or surgical changes since previsit or office visit. 

## 2020-09-16 NOTE — Progress Notes (Signed)
Called to room to assist during endoscopic procedure.  Patient ID and intended procedure confirmed with present staff. Received instructions for my participation in the procedure from the performing physician.  

## 2020-09-16 NOTE — Progress Notes (Signed)
Report given to PACU, vss 

## 2020-09-16 NOTE — Patient Instructions (Signed)
Handouts on polyps, hemorrhoids, and diverticulosis given to you today  Await pathology results    YOU HAD AN ENDOSCOPIC PROCEDURE TODAY AT [facility]:   Refer to the procedure report that was given to you for any specific questions about what was found during the examination.  If the procedure report does not answer your questions, please call your gastroenterologist to clarify.  If you requested that your care partner not be given the details of your procedure findings, then the procedure report has been included in a sealed envelope for you to review at your convenience later.  YOU SHOULD EXPECT: Some feelings of bloating in the abdomen. Passage of more gas than usual.  Walking can help get rid of the air that was put into your GI tract during the procedure and reduce the bloating. If you had a lower endoscopy (such as a colonoscopy or flexible sigmoidoscopy) you may notice spotting of blood in your stool or on the toilet paper. If you underwent a bowel prep for your procedure, you may not have a normal bowel movement for a few days.  Please Note:  You might notice some irritation and congestion in your nose or some drainage.  This is from the oxygen used during your procedure.  There is no need for concern and it should clear up in a day or so.  SYMPTOMS TO REPORT IMMEDIATELY:   Following lower endoscopy (colonoscopy or flexible sigmoidoscopy):  Excessive amounts of blood in the stool  Significant tenderness or worsening of abdominal pains  Swelling of the abdomen that is new, acute  Fever of 100F or higher  For urgent or emergent issues, a gastroenterologist can be reached at any hour by calling [phone]. Do not use MyChart messaging for urgent concerns.    DIET:  We do recommend a small meal at first, but then you may proceed to your regular diet.  Drink plenty of fluids but you should avoid alcoholic beverages for 24 hours.  ACTIVITY:  You should plan to take  it easy for the rest of today and you should NOT DRIVE or use heavy machinery until tomorrow (because of the sedation medicines used during the test).    FOLLOW UP: Our staff will call the number listed on your records 48-72 hours following your procedure to check on you and address any questions or concerns that you may have regarding the information given to you following your procedure. If we do not reach you, we will leave a message.  We will attempt to reach you two times.  During this call, we will ask if you have developed any symptoms of COVID 19. If you develop any symptoms (ie: fever, flu-like symptoms, shortness of breath, cough etc.) before then, please call [phone].  If you test positive for Covid 19 in the 2 weeks post procedure, please call and report this information to [place].    If any biopsies were taken you will be contacted by phone or by letter within the next 1-3 weeks.  Please call us at [phone] if you have not heard about the biopsies in 3 weeks.    SIGNATURES/CONFIDENTIALITY: You and/or your care partner have signed paperwork which will be entered into your electronic medical record.  These signatures attest to the fact that that the information above on your After Visit Summary has been reviewed and is understood.  Full responsibility of the confidentiality of this discharge information lies with you and/or your care-partner.

## 2020-09-16 NOTE — Op Note (Signed)
[facility] Patient Name: Evelyn [name] Procedure Date: 09/16/2020 11:27 AM MRN: [id number] Endoscopist: Meryl Dare , MD Age: 70 Referring MD:  Date of Birth: [birth year] Gender: Female Account #: [id number] Procedure:                Colonoscopy Indications:              Surveillance: Personal history of adenomatous                            polyps on last colonoscopy 5 years ago Medicines:                Monitored Anesthesia Care Procedure:                Pre-Anesthesia Assessment:                           - Prior to the procedure, a History and Physical                            was performed, and patient medications and                            allergies were reviewed. The patient's tolerance of                            previous anesthesia was also reviewed. The risks                            and benefits of the procedure and the sedation                            options and risks were discussed with the patient.                            All questions were answered, and informed consent                            was obtained. Prior Anticoagulants: The patient has                            taken no previous anticoagulant or antiplatelet                            agents. ASA Grade Assessment: II - A patient with                            mild systemic disease. After reviewing the risks                            and benefits, the patient was deemed in                            satisfactory condition to undergo the procedure.  After obtaining informed consent, the colonoscope                            was passed under direct vision. Throughout the                            procedure, the patient's blood pressure, pulse, and                            oxygen saturations were monitored continuously. The                            Olympus CF-HQ190L ([id number]) Colonoscope was                            introduced through the anus  and advanced to the the                            cecum, identified by appendiceal orifice and                            ileocecal valve. The ileocecal valve, appendiceal                            orifice, and rectum were photographed. The quality                            of the bowel preparation was good. The colonoscopy                            was performed without difficulty. The patient                            tolerated the procedure well. Scope In: 11:29:51 AM Scope Out: 34:74:25 AM Scope Withdrawal Time: 0 hours 12 minutes 57 seconds  Total Procedure Duration: 0 hours 16 minutes 55 seconds  Findings:                 The perianal and digital rectal examinations were                            normal.                           Four sessile polyps were found in the ascending                            colon (2) and cecum (2). The polyps were 6 to 7 mm                            in size. These polyps were removed with a cold                            snare. Resection and retrieval were complete.  A diffuse area of moderate melanosis was found in                            the entire colon.                           Internal hemorrhoids were found during                            retroflexion. The hemorrhoids were small and Grade                            I (internal hemorrhoids that do not prolapse).                           The exam was otherwise without abnormality on                            direct and retroflexion views. Complications:            No immediate complications. Estimated blood loss:                            None. Estimated Blood Loss:     Estimated blood loss: none. Impression:               - Four 6 to 7 mm polyps in the ascending colon and                            in the cecum, removed with a cold snare. Resected                            and retrieved.                           - Internal hemorrhoids.                            - Melanosis in the colon.                           - The examination was otherwise normal on direct                            and retroflexion views. Recommendation:           - Repeat colonoscopy date to be determined after                            pending pathology results are reviewed for                            surveillance based on pathology results.                           - Patient has a contact number available for  emergencies. The signs and symptoms of potential                            delayed complications were discussed with the                            patient. Return to normal activities tomorrow.                            Written discharge instructions were provided to the                            patient.                           - Resume previous diet.                           - Continue present medications.                           - Await pathology results. Ladene Artist, MD 09/16/2020 11:50:56 AM This report has been signed electronically.

## 2020-10-06 ENCOUNTER — Encounter: Payer: Self-pay | Admitting: Gastroenterology

## 2020-11-05 DIAGNOSIS — D440 Neoplasm of uncertain behavior of thyroid gland: Secondary | ICD-10-CM | POA: Diagnosis not present

## 2020-11-05 DIAGNOSIS — E042 Nontoxic multinodular goiter: Secondary | ICD-10-CM | POA: Diagnosis not present

## 2020-11-08 DIAGNOSIS — R519 Headache, unspecified: Secondary | ICD-10-CM | POA: Diagnosis not present

## 2020-11-08 DIAGNOSIS — R059 Cough, unspecified: Secondary | ICD-10-CM | POA: Diagnosis not present

## 2020-11-08 DIAGNOSIS — R509 Fever, unspecified: Secondary | ICD-10-CM | POA: Diagnosis not present

## 2020-11-08 DIAGNOSIS — U071 COVID-19: Secondary | ICD-10-CM | POA: Diagnosis not present

## 2020-11-08 DIAGNOSIS — Z20822 Contact with and (suspected) exposure to covid-19: Secondary | ICD-10-CM | POA: Diagnosis not present

## 2020-11-12 ENCOUNTER — Other Ambulatory Visit: Payer: Self-pay | Admitting: Family Medicine

## 2020-11-12 DIAGNOSIS — Z1231 Encounter for screening mammogram for malignant neoplasm of breast: Secondary | ICD-10-CM

## 2020-12-16 DIAGNOSIS — E042 Nontoxic multinodular goiter: Secondary | ICD-10-CM | POA: Diagnosis not present

## 2021-01-05 ENCOUNTER — Ambulatory Visit
Admission: RE | Admit: 2021-01-05 | Discharge: 2021-01-05 | Disposition: A | Discharge status: Home / Self Care | Payer: Medicare Other | Source: Ambulatory Visit | Attending: Family Medicine | Admitting: Family Medicine

## 2021-01-05 ENCOUNTER — Other Ambulatory Visit: Payer: Self-pay

## 2021-01-05 DIAGNOSIS — Z1231 Encounter for screening mammogram for malignant neoplasm of breast: Secondary | ICD-10-CM | POA: Diagnosis not present

## 2021-02-02 ENCOUNTER — Ambulatory Visit
Admission: RE | Admit: 2021-02-02 | Discharge: 2021-02-02 | Disposition: A | Discharge status: Home / Self Care | Payer: Medicare Other | Source: Ambulatory Visit | Attending: Family Medicine | Admitting: Family Medicine

## 2021-02-02 ENCOUNTER — Other Ambulatory Visit: Payer: Self-pay

## 2021-02-02 DIAGNOSIS — M810 Age-related osteoporosis without current pathological fracture: Secondary | ICD-10-CM

## 2021-02-02 DIAGNOSIS — M8589 Other specified disorders of bone density and structure, multiple sites: Secondary | ICD-10-CM | POA: Diagnosis not present

## 2021-03-10 DIAGNOSIS — Z23 Encounter for immunization: Secondary | ICD-10-CM | POA: Diagnosis not present

## 2021-07-23 ENCOUNTER — Emergency Department (HOSPITAL_COMMUNITY): Payer: Medicare Other

## 2021-07-23 ENCOUNTER — Observation Stay (HOSPITAL_COMMUNITY)
Admission: EM | Admit: 2021-07-23 | Discharge: 2021-07-24 | Disposition: A | Discharge status: Home / Self Care | Payer: Medicare Other | Attending: Family Medicine | Admitting: Family Medicine

## 2021-07-23 ENCOUNTER — Observation Stay (HOSPITAL_BASED_OUTPATIENT_CLINIC_OR_DEPARTMENT_OTHER): Payer: Medicare Other

## 2021-07-23 ENCOUNTER — Other Ambulatory Visit: Payer: Self-pay

## 2021-07-23 ENCOUNTER — Encounter (HOSPITAL_COMMUNITY): Payer: Self-pay

## 2021-07-23 DIAGNOSIS — Z79899 Other long term (current) drug therapy: Secondary | ICD-10-CM | POA: Insufficient documentation

## 2021-07-23 DIAGNOSIS — R0789 Other chest pain: Secondary | ICD-10-CM | POA: Diagnosis not present

## 2021-07-23 DIAGNOSIS — Z20822 Contact with and (suspected) exposure to covid-19: Secondary | ICD-10-CM | POA: Insufficient documentation

## 2021-07-23 DIAGNOSIS — R072 Precordial pain: Secondary | ICD-10-CM | POA: Diagnosis not present

## 2021-07-23 DIAGNOSIS — R079 Chest pain, unspecified: Secondary | ICD-10-CM

## 2021-07-23 DIAGNOSIS — I480 Paroxysmal atrial fibrillation: Secondary | ICD-10-CM | POA: Clinically undetermined

## 2021-07-23 DIAGNOSIS — I701 Atherosclerosis of renal artery: Secondary | ICD-10-CM | POA: Diagnosis not present

## 2021-07-23 DIAGNOSIS — K6389 Other specified diseases of intestine: Secondary | ICD-10-CM | POA: Diagnosis not present

## 2021-07-23 DIAGNOSIS — Z87891 Personal history of nicotine dependence: Secondary | ICD-10-CM | POA: Insufficient documentation

## 2021-07-23 DIAGNOSIS — I700 Atherosclerosis of aorta: Secondary | ICD-10-CM | POA: Diagnosis not present

## 2021-07-23 DIAGNOSIS — Z7901 Long term (current) use of anticoagulants: Secondary | ICD-10-CM | POA: Insufficient documentation

## 2021-07-23 DIAGNOSIS — I2510 Atherosclerotic heart disease of native coronary artery without angina pectoris: Secondary | ICD-10-CM | POA: Diagnosis not present

## 2021-07-23 DIAGNOSIS — E785 Hyperlipidemia, unspecified: Secondary | ICD-10-CM | POA: Diagnosis present

## 2021-07-23 DIAGNOSIS — J439 Emphysema, unspecified: Secondary | ICD-10-CM | POA: Diagnosis not present

## 2021-07-23 LAB — BASIC METABOLIC PANEL
Anion gap: 9 (ref 5–15)
BUN: 14 mg/dL (ref 8–23)
CO2: 26 mmol/L (ref 22–32)
Calcium: 8.9 mg/dL (ref 8.9–10.3)
Chloride: 104 mmol/L (ref 98–111)
Creatinine, Ser: 0.72 mg/dL (ref 0.44–1.00)
GFR, Estimated: >60 mL/min (ref >60)
Glucose, Bld: 89 mg/dL (ref 70–99)
Potassium: 3.5 mmol/L (ref 3.5–5.1)
Sodium: 139 mmol/L (ref 135–145)

## 2021-07-23 LAB — ECHOCARDIOGRAM COMPLETE
AR max vel: 2.62 cm2
AV Area VTI: 2.43 cm2
AV Area mean vel: 2.33 cm2
AV Mean grad: 2 mmHg
AV Peak grad: 4.7 mmHg
Ao pk vel: 1.08 m/s
Area-P 1/2: 3.85 cm2
Calc EF: 61.6 %
Height: 61 in
S' Lateral: 2.6 cm
Single Plane A2C EF: 68.7 %
Single Plane A4C EF: 54.6 %
Weight: 2080 oz

## 2021-07-23 LAB — GLUCOSE, CAPILLARY: Glucose-Capillary: 97 mg/dL (ref 70–99)

## 2021-07-23 LAB — CBC WITH DIFFERENTIAL/PLATELET
Abs Immature Granulocytes: 0.05 10*3/uL (ref 0.00–0.07)
Basophils Absolute: 0 10*3/uL (ref 0.0–0.1)
Basophils Relative: 0 %
Eosinophils Absolute: 0.1 10*3/uL (ref 0.0–0.5)
Eosinophils Relative: 1 %
HCT: 39.8 % (ref 36.0–46.0)
Hemoglobin: 13.2 g/dL (ref 12.0–15.0)
Immature Granulocytes: 1 %
Lymphocytes Relative: 16 %
Lymphs Abs: 1.5 10*3/uL (ref 0.7–4.0)
MCH: 31.4 pg (ref 26.0–34.0)
MCHC: 33.2 g/dL (ref 30.0–36.0)
MCV: 94.8 fL (ref 80.0–100.0)
Monocytes Absolute: 0.5 10*3/uL (ref 0.1–1.0)
Monocytes Relative: 6 %
Neutro Abs: 7.2 10*3/uL (ref 1.7–7.7)
Neutrophils Relative %: 76 %
Platelets: 240 10*3/uL (ref 150–400)
RBC: 4.2 MIL/uL (ref 3.87–5.11)
RDW: 11.8 % (ref 11.5–15.5)
WBC: 9.4 10*3/uL (ref 4.0–10.5)
nRBC: 0 % (ref 0.0–0.2)

## 2021-07-23 LAB — TROPONIN I (HIGH SENSITIVITY)
Troponin I (High Sensitivity): 4 ng/L (ref <18)
Troponin I (High Sensitivity): 4 ng/L (ref <18)
Troponin I (High Sensitivity): 3 ng/L (ref <18)
Troponin I (High Sensitivity): 3 ng/L (ref <18)

## 2021-07-23 LAB — D-DIMER, QUANTITATIVE: D-Dimer, Quant: <0.27 ug/mL-FEU (ref 0.00–0.50)

## 2021-07-23 LAB — HIV ANTIBODY (ROUTINE TESTING W REFLEX): HIV Screen 4th Generation wRfx: NONREACTIVE

## 2021-07-23 LAB — RESP PANEL BY RT-PCR (FLU A&B, COVID) ARPGX2
Influenza A by PCR: NEGATIVE
Influenza B by PCR: NEGATIVE
SARS Coronavirus 2 by RT PCR: NEGATIVE

## 2021-07-23 MED ORDER — IOHEXOL 350 MG/ML SOLN
100.0000 mL | Freq: Once | INTRAVENOUS | Status: AC | PRN
  Administered 2021-07-23: 100 mL via INTRAVENOUS

## 2021-07-23 MED ORDER — LORAZEPAM 2 MG/ML IJ SOLN
0.5000 mg | Freq: Once | INTRAMUSCULAR | Status: AC
  Administered 2021-07-23: 0.5 mg via INTRAVENOUS
  Filled 2021-07-23: qty 1

## 2021-07-23 MED ORDER — ENSURE ENLIVE PO LIQD: 237.0000 mL | Freq: Two times a day (BID) | ORAL | Status: DC

## 2021-07-23 MED ORDER — MELATONIN 5 MG PO TABS
5.0000 mg | ORAL_TABLET | Freq: Once | ORAL | Status: AC
  Administered 2021-07-23: 5 mg via ORAL
  Filled 2021-07-23: qty 1

## 2021-07-23 MED ORDER — ACETAMINOPHEN 325 MG PO TABS: 650.0000 mg | ORAL_TABLET | ORAL | Status: DC | PRN

## 2021-07-23 MED ORDER — ASPIRIN 81 MG PO CHEW
324.0000 mg | CHEWABLE_TABLET | Freq: Once | ORAL | Status: AC
  Administered 2021-07-23: 324 mg via ORAL
  Filled 2021-07-23: qty 4

## 2021-07-23 MED ORDER — SODIUM CHLORIDE 0.9 % IV BOLUS
250.0000 mL | Freq: Once | INTRAVENOUS | Status: AC
  Administered 2021-07-23: 250 mL via INTRAVENOUS

## 2021-07-23 MED ORDER — NITROGLYCERIN 0.4 MG SL SUBL
0.4000 mg | SUBLINGUAL_TABLET | SUBLINGUAL | Status: AC | PRN
  Administered 2021-07-23 (×3): 0.4 mg via SUBLINGUAL
  Filled 2021-07-23: qty 1

## 2021-07-23 MED ORDER — MORPHINE SULFATE (PF) 4 MG/ML IV SOLN
4.0000 mg | Freq: Once | INTRAVENOUS | Status: AC
  Administered 2021-07-23: 4 mg via INTRAVENOUS
  Filled 2021-07-23: qty 1

## 2021-07-23 MED ORDER — ENOXAPARIN SODIUM 40 MG/0.4ML IJ SOSY
40.0000 mg | PREFILLED_SYRINGE | INTRAMUSCULAR | Status: DC
  Administered 2021-07-23: 40 mg via SUBCUTANEOUS
  Filled 2021-07-23: qty 0.4

## 2021-07-23 MED ORDER — PROCHLORPERAZINE EDISYLATE 10 MG/2ML IJ SOLN: 10.0000 mg | Freq: Four times a day (QID) | INTRAMUSCULAR | Status: DC | PRN

## 2021-07-23 MED ORDER — ACETAMINOPHEN 500 MG PO TABS
1000.0000 mg | ORAL_TABLET | Freq: Once | ORAL | Status: AC | PRN
  Administered 2021-07-23: 1000 mg via ORAL
  Filled 2021-07-23: qty 2

## 2021-07-23 MED ORDER — ALUM & MAG HYDROXIDE-SIMETH 200-200-20 MG/5ML PO SUSP
30.0000 mL | Freq: Once | ORAL | Status: AC
  Administered 2021-07-23: 30 mL via ORAL
  Filled 2021-07-23: qty 30

## 2021-07-23 MED ORDER — ONDANSETRON HCL 4 MG/2ML IJ SOLN
4.0000 mg | Freq: Once | INTRAMUSCULAR | Status: DC
  Filled 2021-07-23: qty 2

## 2021-07-23 MED ORDER — KETOROLAC TROMETHAMINE 15 MG/ML IJ SOLN
15.0000 mg | Freq: Once | INTRAMUSCULAR | Status: DC
  Filled 2021-07-23: qty 1

## 2021-07-23 MED ORDER — ONDANSETRON HCL 4 MG/2ML IJ SOLN: 4.0000 mg | Freq: Four times a day (QID) | INTRAMUSCULAR | Status: DC | PRN

## 2021-07-23 MED ORDER — ASPIRIN EC 81 MG PO TBEC
81.0000 mg | DELAYED_RELEASE_TABLET | Freq: Every day | ORAL | Status: DC
  Administered 2021-07-23 – 2021-07-24 (×2): 81 mg via ORAL
  Filled 2021-07-23 (×2): qty 1

## 2021-07-23 MED ORDER — MORPHINE SULFATE (PF) 2 MG/ML IV SOLN
2.0000 mg | INTRAVENOUS | Status: DC | PRN
  Administered 2021-07-23: 2 mg via INTRAVENOUS
  Filled 2021-07-23: qty 1

## 2021-07-23 NOTE — ED Provider Notes (Signed)
Patient signed to me pending results of work-up.  Patient's first troponin negative.  Spoke with patient and she states that she has had chest pressure that radiated to her back.  It is worse with taking a deep breath.  Was relieved after taking 3 nitroglycerin.  Concern for possible dissection.  CT angio has been ordered ? ?10:33 AM ?Patient CT angio chest negative for.  Troponin negative x2 here.  Patient's pain is all mostly at this time.  She has a heart score of 5.  \Plan will be to admit to the hospital ? ?  ?Lacretia Leigh, MD ?07/23/21 1034 ? ?

## 2021-07-23 NOTE — Plan of Care (Signed)
?  Problem: Education: Goal: Knowledge of General Education information will improve Description: Including pain rating scale, medication(s)/side effects and non-pharmacologic comfort measures Outcome: Progressing   Problem: Health Behavior/Discharge Planning: Goal: Ability to manage health-related needs will improve Outcome: Progressing   Problem: Clinical Measurements: Goal: Ability to maintain clinical measurements within normal limits will improve Outcome: Progressing Goal: Will remain free from infection Outcome: Progressing   Problem: Nutrition: Goal: Adequate nutrition will be maintained Outcome: Progressing   

## 2021-07-23 NOTE — Progress Notes (Signed)
? ?  Echocardiogram ?2D Echocardiogram has been performed. ? ?Evelyn [name] ?07/23/2021, 2:29 PM ?

## 2021-07-23 NOTE — ED Triage Notes (Signed)
Patient woke up 1 hour ago with chest pain. She said it feels really heavy on her chest. No nausea, vomiting, sweating. She said it hurts to breathe.  ?

## 2021-07-23 NOTE — H&P (Signed)
?History and Physical  ? ? ?Patient: Evelyn [name] JKK:[id number] DOB: [birth year] ?DOA: 07/23/2021 ?DOS: the patient was seen and examined on 07/23/2021 ?PCP: Maury Dus, MD  ?Patient coming from: Home ? ?Chief Complaint:  ?Chief Complaint  ?Patient presents with  ? Chest Pain  ? ? ?HPI: Evelyn [name] is a 71 y.o. female with medical history significant of HLD, osteoporosis. Presenting with chest pain. Her pain started this morning around 4am. She describes it as a substernal tightness that woke her from sleep. It did not radiate. She didn't try any medications to help. It reminded her of when she first had COVID, so she thought that she may being suffering from another bout of that. Her pain seemed worse with breathing. So she decided to come to the ED to be evaluated. She denies any other aggravating or alleviating factors.   ? ?Review of Systems: As mentioned in the history of present illness. All other systems reviewed and are negative. ?Past Medical History:  ?Diagnosis Date  ? Allergy   ? seasonal  ? Cataract   ? bilateral removed  ? Hyperlipidemia   ? Osteopenia   ? Osteoporosis   ? ?Past Surgical History:  ?Procedure Laterality Date  ? ABDOMINAL HYSTERECTOMY    ? CARPAL TUNNEL RELEASE Bilateral   ? CATARACT EXTRACTION Bilateral   ? CESAREAN SECTION    ? x2  ? COLONOSCOPY    ? [place]  ? ?Social History:  reports that she has quit smoking. She has never used smokeless tobacco. She reports that she does not currently use alcohol. She reports that she does not use drugs. ? ?Allergies  ?Allergen Reactions  ? Advil [Ibuprofen] Nausea Only  ? ? ?Family History  ?Problem Relation Age of Onset  ? Colon cancer Neg Hx   ? Colon polyps Neg Hx   ? Esophageal cancer Neg Hx   ? Rectal cancer Neg Hx   ? Stomach cancer Neg Hx   ? ? ?Prior to Admission medications   ?Medication Sig Start Date End Date Taking? Authorizing Provider  ?Calcium Carbonate-Vitamin D (OS-CAL 500 + D PO) Take 1 tablet by  mouth daily.    [provider]  ?cholecalciferol (VITAMIN D) 1000 UNITS tablet Take 1,000 Units by mouth daily.    [provider]  ?fexofenadine (ALLEGRA) 60 MG tablet Take 60 mg by mouth as needed for allergies or rhinitis.    [provider]  ?raloxifene (EVISTA) 60 MG tablet Take 60 mg by mouth daily.    [provider]  ?rosuvastatin (CRESTOR) 10 MG tablet Take 10 mg by mouth daily.    [provider]  ? ? ?Physical Exam: ?Vitals:  ? 07/23/21 0845 07/23/21 0900 07/23/21 0930 07/23/21 1030  ?BP: (!) 128/59 110/74 104/64 139/63  ?Pulse: 78  76 86  ?Resp: 20 (!) 21 (!) 21 18  ?Temp:      ?TempSrc:      ?SpO2: 97%  98% 99%  ?Weight:      ?Height:      ? ?General: 71 y.o. female resting in bed in NAD ?Eyes: PERRL, normal sclera ?ENMT: Nares patent w/o discharge, orophaynx clear, dentition normal, ears w/o discharge/lesions/ulcers ?Neck: Supple, trachea midline ?Cardiovascular: RRR, +S1, S2, no m/g/r, equal pulses throughout ?Respiratory: CTABL, no w/r/r, normal WOB ?GI: BS+, NDNT, no masses noted, no organomegaly noted ?MSK: No e/c/c ?Neuro: A&O x 3, no focal deficits ?Psyc: Appropriate interaction and affect, calm/cooperative ? ?  Data Reviewed: ? ?Trp 4 -> 4 ?WBC 9.4 ?D-dimer <0.27 ? ?EKG: sinus, no st elevations ? ?CTA chest/ab/pelvis: 1. No acute abnormality of the thoracic or abdominal aorta. 2. Severe emphysematous changes the lungs. 3. Long segment, diffuse, mild thickening of the descending and sigmoid colon wall without significant adjacent pericolonic fat stranding. These findings are nonspecific, however may be due to chronic inflammation. ? ?Assessment and Plan: ?No notes have been filed under this hospital service. ?Service: Hospitalist ?Chest pain ?    - place in obs, tele ?    - EKG sinus, no st elevations ?    - trp neg x 2; d-dimer is negative ?    - CTA chest: no clot, no dissection; emphysema noted ?    - repeat EKG in AM ?    - check echo, trend another  set of trp ?    - check lipid panel ?    - add ASA ?    - COVID/flu negative; respiratory status is stable ? ?HLD ?    - statin ?    - check lipid panel ? ?Advance Care Planning:   Code Status: FULL ? ?Consults: None ? ?Family Communication: w/ husband at bedside ? ?Severity of Illness: ?The appropriate patient status for this patient is OBSERVATION. Observation status is judged to be reasonable and necessary in order to provide the required intensity of service to ensure the patient's safety. The patient's presenting symptoms, physical exam findings, and initial radiographic and laboratory data in the context of their medical condition is felt to place them at decreased risk for further clinical deterioration. Furthermore, it is anticipated that the patient will be medically stable for discharge from the hospital within 2 midnights of admission.  ? ?Author: ?Jonnie Finner, DO ?07/23/2021 11:05 AM ? ?For on call review [email or web address].  ?

## 2021-07-23 NOTE — ED Provider Notes (Signed)
[place] DEPT Provider Note: Georgena Spurling, MD, FACEP  CSN: [id number] MRN: [id number] ARRIVAL: 07/23/21 at 0539 ROOM: WA23/WA23   CHIEF COMPLAINT  Chest Pain   HISTORY OF PRESENT ILLNESS  07/23/21 5:50 AM Evelyn [name] is a 71 y.o. female without significant past medical history apart from hyperlipidemia.  She is here with chest pain that awakened her about 1 hour ago.  She feels it across her precordium and describes it as a pressure.  She rates it as a 7 out of 10.  She feels short of breath and the pain is worse with taking a breath.  She has no nausea or vomiting with it.  She has had no diaphoresis with it.    Past Medical History:  Diagnosis Date   Allergy    seasonal   Cataract    bilateral removed   Hyperlipidemia    Osteopenia    Osteoporosis     Past Surgical History:  Procedure Laterality Date   ABDOMINAL HYSTERECTOMY     CARPAL TUNNEL RELEASE Bilateral    CATARACT EXTRACTION Bilateral    CESAREAN SECTION     x2   COLONOSCOPY     UTERINE FIBROID SURGERY  1999    Family History  Problem Relation Age of Onset   Colon cancer Neg Hx    Colon polyps Neg Hx    Esophageal cancer Neg Hx    Rectal cancer Neg Hx    Stomach cancer Neg Hx     Social History   Tobacco Use   Smoking status: Former   Smokeless tobacco: Never  Scientific laboratory technician Use: Never used  Substance Use Topics   Alcohol use: Not Currently    Comment: occa   Drug use: No    Prior to Admission medications   Medication Sig Start Date End Date Taking? Authorizing Provider  Calcium Carb-Cholecalciferol (CALCIUM 600+D3 PO) Take 1 tablet by mouth daily with breakfast.   Yes [provider]  fexofenadine (ALLEGRA) 180 MG tablet Take 180 mg by mouth daily as needed (for seasonal allergies).   Yes [provider]  raloxifene (EVISTA) 60 MG tablet Take 60 mg by mouth in the morning.   Yes [provider]  rosuvastatin (CRESTOR) 10 MG tablet Take 10 mg by  mouth in the morning.   Yes [provider]    Allergies Ibuprofen   REVIEW OF SYSTEMS  Negative except as noted here or in the History of Present Illness.   PHYSICAL EXAMINATION  Initial Vital Signs Blood pressure (!) 152/83, pulse (!) 104, resp. rate 19, height 5\' 1"  (1.549 m), weight 59 kg, SpO2 96 %.  Examination General: Well-developed, well-nourished female in no acute distress; appearance consistent with age of record HENT: normocephalic; atraumatic Eyes: Normal appearance Neck: supple Heart: regular rate and rhythm; tachycardia Lungs: clear to auscultation bilaterally Abdomen: soft; nondistended; nontender; bowel sounds present Extremities: No deformity; full range of motion; pulses normal Neurologic: Awake, alert and oriented; motor function intact in all extremities and symmetric; no facial droop Skin: Warm and dry Psychiatric: Anxious   RESULTS  Summary of this visit's results, reviewed and interpreted by myself:   EKG Interpretation  Date/Time:  Friday July 23 2021 07:14:05 EST Ventricular Rate:  84 PR Interval:  156 QRS Duration: 98 QT Interval:  376 QTC Calculation: 445 R Axis:   79 Text Interpretation: Sinus rhythm Borderline repolarization abnormality Confirmed by Lacretia Leigh ([id number]) on 07/23/2021 10:33:39 AM  Laboratory Studies: Results for orders placed or performed during the hospital encounter of 07/23/21 (from the past 24 hour(s))  Resp Panel by RT-PCR (Flu A&B, Covid) Nasopharyngeal Swab     Status: None   Collection Time: 07/23/21  5:52 AM   Specimen: Nasopharyngeal Swab; Nasopharyngeal(NP) swabs in vial transport medium  Result Value Ref Range   SARS Coronavirus 2 by RT PCR NEGATIVE NEGATIVE   Influenza A by PCR NEGATIVE NEGATIVE   Influenza B by PCR NEGATIVE NEGATIVE  Basic metabolic panel     Status: None   Collection Time: 07/23/21  6:20 AM  Result Value Ref Range   Sodium 139 135 - 145 mmol/L   Potassium 3.5 3.5 -  5.1 mmol/L   Chloride 104 98 - 111 mmol/L   CO2 26 22 - 32 mmol/L   Glucose, Bld 89 70 - 99 mg/dL   BUN 14 8 - 23 mg/dL   Creatinine, Ser 0.72 0.44 - 1.00 mg/dL   Calcium 8.9 8.9 - 10.3 mg/dL   GFR, Estimated >60 >60 mL/min   Anion gap 9 5 - 15  Troponin I (High Sensitivity)     Status: None   Collection Time: 07/23/21  6:20 AM  Result Value Ref Range   Troponin I (High Sensitivity) 4 <18 ng/L  CBC with Differential     Status: None   Collection Time: 07/23/21  6:20 AM  Result Value Ref Range   WBC 9.4 4.0 - 10.5 K/uL   RBC 4.20 3.87 - 5.11 MIL/uL   Hemoglobin 13.2 12.0 - 15.0 g/dL   HCT 39.8 36.0 - 46.0 %   MCV 94.8 80.0 - 100.0 fL   MCH 31.4 26.0 - 34.0 pg   MCHC 33.2 30.0 - 36.0 g/dL   RDW 11.8 11.5 - 15.5 %   Platelets 240 150 - 400 K/uL   nRBC 0.0 0.0 - 0.2 %   Neutrophils Relative % 76 %   Neutro Abs 7.2 1.7 - 7.7 K/uL   Lymphocytes Relative 16 %   Lymphs Abs 1.5 0.7 - 4.0 K/uL   Monocytes Relative 6 %   Monocytes Absolute 0.5 0.1 - 1.0 K/uL   Eosinophils Relative 1 %   Eosinophils Absolute 0.1 0.0 - 0.5 K/uL   Basophils Relative 0 %   Basophils Absolute 0.0 0.0 - 0.1 K/uL   Immature Granulocytes 1 %   Abs Immature Granulocytes 0.05 0.00 - 0.07 K/uL  D-dimer, quantitative     Status: None   Collection Time: 07/23/21  6:20 AM  Result Value Ref Range   D-Dimer, Quant <0.27 0.00 - 0.50 ug/mL-FEU  Troponin I (High Sensitivity)     Status: None   Collection Time: 07/23/21  7:46 AM  Result Value Ref Range   Troponin I (High Sensitivity) 4 <18 ng/L  HIV Antibody (routine testing w rflx)     Status: None   Collection Time: 07/23/21 12:55 PM  Result Value Ref Range   HIV Screen 4th Generation wRfx Non Reactive Non Reactive  Troponin I (High Sensitivity)     Status: None   Collection Time: 07/23/21 12:55 PM  Result Value Ref Range   Troponin I (High Sensitivity) 3 <18 ng/L  Troponin I (High Sensitivity)     Status: None   Collection Time: 07/23/21  2:55 PM  Result  Value Ref Range   Troponin I (High Sensitivity) 3 <18 ng/L  Glucose, capillary     Status: None   Collection Time: 07/23/21  8:14  PM  Result Value Ref Range   Glucose-Capillary 97 70 - 99 mg/dL   Imaging Studies: DG Chest 2 View  Result Date: 07/23/2021 CLINICAL DATA:  Chest pain. EXAM: CHEST - 2 VIEW COMPARISON:  None. FINDINGS: Lungs are hyperexpanded. The lungs are clear without focal pneumonia, edema, pneumothorax or pleural effusion. The cardiopericardial silhouette is within normal limits for size. The visualized bony structures of the thorax are unremarkable. Telemetry leads overlie the chest. IMPRESSION: Hyperexpansion without acute cardiopulmonary findings. Electronically Signed   By: Misty Stanley M.D.   On: 07/23/2021 07:35   ECHOCARDIOGRAM COMPLETE  Result Date: 07/23/2021    ECHOCARDIOGRAM REPORT   Patient Name:   [facility] [name] Date of Exam: 07/23/2021 Medical Rec #:  [id number]          Height:       61.0 in Accession #:    [id number]         Weight:       130.0 lb Date of Birth:  Aug 13, 1950          BSA:          1.573 m Patient Age:    68 years           BP:           106/81 mmHg Patient Gender: F                  HR:           88 bpm. Exam Location:  Inpatient Procedure: 2D Echo, Cardiac Doppler and Color Doppler Indications:    R07.9 CHEST PAIN  History:        Patient has no prior history of Echocardiogram examinations.                 Risk Factors:Dyslipidemia.  Sonographer:    Beryle Beams Referring Phys: [id number] [place]  1. Left ventricular ejection fraction, by estimation, is 55 to 60%. The left ventricle has normal function. The left ventricle has no regional wall motion abnormalities. Left ventricular diastolic parameters were normal.  2. Right ventricular systolic function is normal. The right ventricular size is normal.  3. The mitral valve is normal in structure. No evidence of mitral valve regurgitation. No evidence of mitral stenosis.  4. The aortic  valve is normal in structure. Aortic valve regurgitation is not visualized. No aortic stenosis is present.  5. The inferior vena cava is normal in size with greater than 50% respiratory variability, suggesting right atrial pressure of 3 mmHg. FINDINGS  Left Ventricle: Left ventricular ejection fraction, by estimation, is 55 to 60%. The left ventricle has normal function. The left ventricle has no regional wall motion abnormalities. The left ventricular internal cavity size was normal in size. There is  no left ventricular hypertrophy. Left ventricular diastolic parameters were normal. Right Ventricle: The right ventricular size is normal. No increase in right ventricular wall thickness. Right ventricular systolic function is normal. Left Atrium: Left atrial size was normal in size. Right Atrium: Right atrial size was normal in size. Pericardium: There is no evidence of pericardial effusion. Mitral Valve: The mitral valve is normal in structure. No evidence of mitral valve regurgitation. No evidence of mitral valve stenosis. Tricuspid Valve: The tricuspid valve is normal in structure. Tricuspid valve regurgitation is not demonstrated. No evidence of tricuspid stenosis. Aortic Valve: The aortic valve is normal in structure. Aortic valve regurgitation is not visualized. No aortic stenosis is present. Aortic  valve mean gradient measures 2.0 mmHg. Aortic valve peak gradient measures 4.7 mmHg. Aortic valve area, by VTI measures 2.43 cm. Pulmonic Valve: The pulmonic valve was normal in structure. Pulmonic valve regurgitation is not visualized. No evidence of pulmonic stenosis. Aorta: The aortic root is normal in size and structure. Venous: The inferior vena cava is normal in size with greater than 50% respiratory variability, suggesting right atrial pressure of 3 mmHg. IAS/Shunts: The interatrial septum appears to be lipomatous. No atrial level shunt detected by color flow Doppler.  LEFT VENTRICLE PLAX 2D LVIDd:          4.10 cm LVIDs:         2.60 cm LV PW:         1.00 cm LV IVS:        1.00 cm LVOT diam:     1.80 cm LV SV:         57 LV SV Index:   36 LVOT Area:     2.54 cm  LV Volumes (MOD) LV vol d, MOD A2C: 39.0 ml LV vol d, MOD A4C: 31.3 ml LV vol s, MOD A2C: 12.2 ml LV vol s, MOD A4C: 14.2 ml LV SV MOD A2C:     26.8 ml LV SV MOD A4C:     31.3 ml LV SV MOD BP:      22.0 ml RIGHT VENTRICLE             IVC RV S prime:     10.90 cm/s  IVC diam: 1.20 cm TAPSE (M-mode): 1.3 cm LEFT ATRIUM             Index LA diam:        2.80 cm 1.78 cm/m LA Vol (A2C):   24.4 ml 15.51 ml/m LA Vol (A4C):   28.9 ml 18.38 ml/m LA Biplane Vol: 26.8 ml 17.04 ml/m  AORTIC VALVE                    PULMONIC VALVE AV Area (Vmax):    2.62 cm     PV Vmax:       0.52 m/s AV Area (Vmean):   2.33 cm     PV Vmean:      30.600 cm/s AV Area (VTI):     2.43 cm     PV VTI:        0.099 m AV Vmax:           108.00 cm/s  PV Peak grad:  1.1 mmHg AV Vmean:          72.400 cm/s  PV Mean grad:  0.0 mmHg AV VTI:            0.236 m AV Peak Grad:      4.7 mmHg AV Mean Grad:      2.0 mmHg LVOT Vmax:         111.00 cm/s LVOT Vmean:        66.200 cm/s LVOT VTI:          0.225 m LVOT/AV VTI ratio: 0.95  AORTA Ao Root diam: 2.20 cm MITRAL VALVE               TRICUSPID VALVE MV Area (PHT): 3.85 cm    TR Peak grad:   18.0 mmHg MV Decel Time: 197 msec    TR Mean grad:   14.0 mmHg MV E velocity: 87.40 cm/s  TR Vmax:        212.00  cm/s MV A velocity: 72.00 cm/s  TR Vmean:       178.0 cm/s MV E/A ratio:  1.21                            SHUNTS                            Systemic VTI:  0.22 m                            Systemic Diam: 1.80 cm Jenkins Rouge MD Electronically signed by Jenkins Rouge MD Signature Date/Time: 07/23/2021/2:42:02 PM    Final    CT Angio Chest/Abd/Pel for Dissection W and/or W/WO  Result Date: 07/23/2021 CLINICAL DATA:  Acute aortic syndrome Acute chest pain and chest heaviness Hurts to breathe EXAM: CT ANGIOGRAPHY CHEST, ABDOMEN AND PELVIS TECHNIQUE:  Non-contrast CT of the chest was initially obtained. Multidetector CT imaging through the chest, abdomen and pelvis was performed using the standard protocol during bolus administration of intravenous contrast. Multiplanar reconstructed images and MIPs were obtained and reviewed to evaluate the vascular anatomy. RADIATION DOSE REDUCTION: This exam was performed according to the departmental dose-optimization program which includes automated exposure control, adjustment of the mA and/or kV according to patient size and/or use of iterative reconstruction technique. CONTRAST:  131mL OMNIPAQUE IOHEXOL 350 MG/ML SOLN COMPARISON:  None. FINDINGS: CTA CHEST FINDINGS Cardiovascular: No aortic intramural hematoma identified on precontrast images. No thoracic aortic aneurysm or dissection. Scattered calcified atheromatous plaque is present. Heart size is within normal limits. Mediastinum/Nodes: Heterogeneous thyroid extending into the left tracheoesophageal groove is seen in better assessed by thyroid ultrasound from 11/12/2019. No enlarged mediastinal, hilar, or axillary lymph nodes. Lungs/Pleura: Severe emphysematous changes of the lungs. No focal airspace opacity to indicate pneumonia. Musculoskeletal: No chest wall abnormality. No acute or significant osseous findings. Review of the MIP images confirms the above findings. CTA ABDOMEN AND PELVIS FINDINGS VASCULAR Aorta: Diffuse atherosclerotic calcified plaque without aneurysm or flow-limiting stenosis. Celiac: Patent without evidence of aneurysm, dissection, vasculitis or significant stenosis. SMA: Patent without evidence of aneurysm, dissection, vasculitis or significant stenosis. Renals: Mild to moderate stenosis at the origin of the left main renal artery. No significant stenosis of the right main renal artery. IMA: Patent without evidence of aneurysm, dissection, vasculitis or significant stenosis. Inflow: Scattered calcified atheromatous plaque of the inflow arteries  bilaterally without flow-limiting stenosis. Veins: No obvious venous abnormality within the limitations of this arterial phase study. Review of the MIP images confirms the above findings. NON-VASCULAR Hepatobiliary: No focal liver abnormality is seen. No gallstones, gallbladder wall thickening, or biliary dilatation. Pancreas: Unremarkable. No pancreatic ductal dilatation or surrounding inflammatory changes. Spleen: Normal in size without focal abnormality. Adrenals/Urinary Tract: Adrenal glands are normal. Kidneys, ureters, and bladder unremarkable. Stomach/Bowel: Mild diffuse thickening of the wall of the descending and sigmoid colon without significant adjacent pericolonic fat stranding. Appendix is normal. Lymphatic: No enlarged abdominopelvic lymph nodes. Reproductive: Status post hysterectomy. No adnexal masses. Other: No abdominal wall hernia or abnormality. No abdominopelvic ascites. Musculoskeletal: No acute or significant osseous findings. Review of the MIP images confirms the above findings. IMPRESSION: 1. No acute abnormality of the thoracic or abdominal aorta. 2. Severe emphysematous changes the lungs. 3. Long segment, diffuse, mild thickening of the descending and sigmoid colon wall without significant adjacent pericolonic fat stranding. These findings  are nonspecific, however may be due to chronic inflammation. Electronically Signed   By: Miachel Roux M.D.   On: 07/23/2021 10:14    ED COURSE and MDM  Nursing notes, initial and subsequent vitals signs, including pulse oximetry, reviewed and interpreted by myself.  Vitals:   07/23/21 1530 07/23/21 1600 07/23/21 1752 07/23/21 2016  BP: 125/60 131/67 (!) 152/68 134/64  Pulse: 93 89  95  Resp: (!) 26 (!) 26 (!) 26 20  Temp:   98.6 F (37 C) 99.2 F (37.3 C)  TempSrc:   Oral Oral  SpO2: 99% 99% 98% 95%  Weight:      Height:       Medications  acetaminophen (TYLENOL) tablet 650 mg (has no administration in time range)  ondansetron  (ZOFRAN) injection 4 mg (has no administration in time range)  enoxaparin (LOVENOX) injection 40 mg (40 mg Subcutaneous Given 07/23/21 2134)  aspirin EC tablet 81 mg (81 mg Oral Given 07/23/21 1301)  prochlorperazine (COMPAZINE) injection 10 mg (has no administration in time range)  morphine (PF) 2 MG/ML injection 2 mg (2 mg Intravenous Given 07/23/21 2130)  feeding supplement (ENSURE ENLIVE / ENSURE PLUS) liquid 237 mL (has no administration in time range)  aspirin chewable tablet 324 mg (324 mg Oral Given 07/23/21 0617)  nitroGLYCERIN (NITROSTAT) SL tablet 0.4 mg (0.4 mg Sublingual Given 07/23/21 0626)  acetaminophen (TYLENOL) tablet 1,000 mg (1,000 mg Oral Given 07/23/21 0650)  sodium chloride 0.9 % bolus 250 mL (0 mLs Intravenous Stopped 07/23/21 0650)  LORazepam (ATIVAN) injection 0.5 mg (0.5 mg Intravenous Given 07/23/21 0653)  iohexol (OMNIPAQUE) 350 MG/ML injection 100 mL (100 mLs Intravenous Contrast Given 07/23/21 0943)  morphine (PF) 4 MG/ML injection 4 mg (4 mg Intravenous Given 07/23/21 1057)  alum & mag hydroxide-simeth (MAALOX/MYLANTA) 200-200-20 MG/5ML suspension 30 mL (30 mLs Oral Given 07/23/21 1301)  melatonin tablet 5 mg (5 mg Oral Given 07/23/21 2134)   6:21 AM Symptoms concerning for cardiac etiology but no STEMI on initial EKG.  Patient given aspirin and we will trial nitroglycerin sublingually.   6:30 AM Chest pain almost completely resolved with 3 sublingual nitroglycerin tablets.  7:01 AM Patient rates her pain as a 5 out of 10 when she breathes.  When she holds her breath the pain is not present.  This sounds pleuritic in nature.  We will trial Toradol and see if this improves her pain.  D-dimer is normal. Signed out to Dr. Zenia Resides.   PROCEDURES  Procedures   ED DIAGNOSES     ICD-10-CM   1. Chest pain, unspecified type  R07.9          Carissa Musick, Jenny Reichmann, MD 07/23/21 2236

## 2021-07-23 NOTE — Progress Notes (Signed)
?   07/23/21 1752  ?Assess: MEWS Score  ?Temp 98.6 ?F (37 ?C)  ?BP (!) 152/68  ?Resp (!) 26  ?Level of Consciousness Alert  ?SpO2 98 %  ?O2 Device Room Air  ?Assess: MEWS Score  ?MEWS Temp 0  ?MEWS Systolic 0  ?MEWS Pulse 0  ?MEWS RR 2  ?MEWS LOC 0  ?MEWS Score 2  ?MEWS Score Color Yellow  ?Assess: if the MEWS score is Yellow or Red  ?Were vital signs taken at a resting state? Yes  ?Focused Assessment No change from prior assessment  ?Does the patient meet 2 or more of the SIRS criteria? No  ?MEWS guidelines implemented *See Row Information* Yes  ?Treat  ?MEWS Interventions Other (Comment)  ?Take Vital Signs  ?Increase Vital Sign Frequency  Yellow: Q 2hr X 2 then Q 4hr X 2, if remains yellow, continue Q 4hrs  ?Escalate  ?MEWS: Escalate Yellow: discuss with charge nurse/RN and consider discussing with provider and RRT  ?Notify: Charge Nurse/RN  ?Name of Charge Nurse/RN Notified Myriam Jacobson RN  ?Date Charge Nurse/RN Notified 07/23/21  ?Time Charge Nurse/RN Notified 1809  ?Notify: Provider  ?Provider Name/Title Marylyn Ishihara MD  ?Date Provider Notified 07/23/21  ?Time Provider Notified [phone]  ?Notification Type Page  ?Notification Reason Other (Comment) ?(yellow MEWS)  ?Provider response No new orders  ?Date of Provider Response 07/23/21  ?Time of Provider Response 1810  ?Document  ?Patient Outcome Other (Comment) ?(admission. pt. is stable)  ?Assess: SIRS CRITERIA  ?SIRS Temperature  0  ?SIRS Pulse 0  ?SIRS Respirations  1  ?SIRS WBC 0  ?SIRS Score Sum  1  ? ? ?Pt. Is new admit, alert and oriented not in distress. Needs attended, closely monitored. ?

## 2021-07-23 NOTE — ED Notes (Signed)
Pain worsened by deep breath.  ?

## 2021-07-23 NOTE — ED Notes (Signed)
Pt transported to CT ?

## 2021-07-24 DIAGNOSIS — I480 Paroxysmal atrial fibrillation: Secondary | ICD-10-CM | POA: Diagnosis not present

## 2021-07-24 DIAGNOSIS — Z79899 Other long term (current) drug therapy: Secondary | ICD-10-CM | POA: Diagnosis not present

## 2021-07-24 DIAGNOSIS — Z7901 Long term (current) use of anticoagulants: Secondary | ICD-10-CM | POA: Diagnosis not present

## 2021-07-24 DIAGNOSIS — R079 Chest pain, unspecified: Secondary | ICD-10-CM

## 2021-07-24 DIAGNOSIS — Z87891 Personal history of nicotine dependence: Secondary | ICD-10-CM | POA: Diagnosis not present

## 2021-07-24 DIAGNOSIS — R072 Precordial pain: Secondary | ICD-10-CM | POA: Diagnosis not present

## 2021-07-24 DIAGNOSIS — Z20822 Contact with and (suspected) exposure to covid-19: Secondary | ICD-10-CM | POA: Diagnosis not present

## 2021-07-24 LAB — LIPID PANEL
Cholesterol: 153 mg/dL (ref 0–200)
HDL: 78 mg/dL (ref >40)
LDL Cholesterol: 55 mg/dL (ref 0–99)
Total CHOL/HDL Ratio: 2 RATIO
Triglycerides: 99 mg/dL (ref <150)
VLDL: 20 mg/dL (ref 0–40)

## 2021-07-24 MED ORDER — APIXABAN 5 MG PO TABS
5.0000 mg | ORAL_TABLET | Freq: Once | ORAL | Status: AC
  Administered 2021-07-24: 5 mg via ORAL
  Filled 2021-07-24: qty 1

## 2021-07-24 MED ORDER — METOPROLOL TARTRATE 25 MG PO TABS: 25.0000 mg | ORAL_TABLET | Freq: Two times a day (BID) | ORAL | 0 refills | Status: DC

## 2021-07-24 MED ORDER — APIXABAN 5 MG PO TABS: 5.0000 mg | ORAL_TABLET | Freq: Two times a day (BID) | ORAL | 0 refills | Status: DC

## 2021-07-24 MED ORDER — METOPROLOL TARTRATE 5 MG/5ML IV SOLN
5.0000 mg | INTRAVENOUS | Status: AC | PRN
  Administered 2021-07-24 (×2): 5 mg via INTRAVENOUS
  Filled 2021-07-24 (×2): qty 5

## 2021-07-24 NOTE — Progress Notes (Signed)
?  Transition of Care (TOC) Screening Note ? ? ?Patient Details  ?Name: Evelyn [name] ?Date of Birth: Jul 20, 1950 ? ? ?Transition of Care [facility]) CM/SW Contact:    ?Dessa Phi, RN ?Phone Number: ?07/24/2021, 10:29 AM ? ? ? ?Transition of Care Department [facility]) has reviewed patient and no TOC needs have been identified at this time. We will continue to monitor patient advancement through interdisciplinary progression rounds. If new patient transition needs arise, please place a TOC consult. ?  ?

## 2021-07-24 NOTE — Progress Notes (Signed)
RN was notified of pt HR elevation as high as 160 with a fib rhythm. Pt was resting in bed when RN went to assess. Pt reported she just got up to use the restroom. Denies chest pain and heart flutter/racing feeling. Said she only felt "a little winded." EKG performed. HR 110-140s and a fib rhythm during RN assessment. Pt was resting in bed throughout. Vitals stable. On-call notified and placed order. RN completed order and continued to monitor. HR 80s-100s.  ?

## 2021-07-24 NOTE — Discharge Summary (Signed)
Physician Discharge Summary  Evelyn [name] QHU:[id number] DOB: July 06, 1950 DOA: 07/23/2021  PCP: Evelyn Dus, MD  Admit date: 07/23/2021 Discharge date: 07/24/2021  Admitted From: Home Disposition:  Home  Recommendations for Outpatient Follow-up:  Follow up with PCP in 1 week Patient was newly diagnosed with paroxysmal atrial fibrillation.  She was started on metoprolol 25 mg twice daily and Eliquis 5 mg twice daily.  CHA2DS2-VASc score is 2.  Recommend outpatient echocardiogram and outpatient monitoring, may wish to refer to cardiology. Also consider outpatient thyroid levels.   Home Health: No Equipment/Devices: None  Discharge Condition: Good CODE STATUS: Full Diet recommendation: Heart healthy  Brief/Interim Summary: From H&P: From Dr. Cherylann [name]: "Evelyn [name] is a 71 y.o. female with medical history significant of HLD, osteoporosis. Presenting with chest pain. Her pain started this morning around 4am. She describes it as a substernal tightness that woke her from sleep. It did not radiate. She didn't try any medications to help. It reminded her of when she first had COVID, so she thought that she may being suffering from another bout of that. Her pain seemed worse with breathing. So she decided to come to the ED to be evaluated. She denies any other aggravating or alleviating factors."  Interim: Troponins neg. Pt asymptomatic.   Subjective on day of discharge: Patient's heart rate increased overnight.  EKG revealed atrial fibrillation.  She was started on Eliquis 5 mg twice daily with a CHA2DS2-VASc score of 2 given her age and gender.  She was also started on metoprolol given her heart rate.  Discharge Diagnoses:  Principal Problem:   Chest pain  Reassurance for now, she will follow-up with her PCP to discuss the next steps  Active Problems:   HLD (hyperlipidemia)  Continue Crestor    Paroxysmal atrial fibrillation (HCC)  Metoprolol tartrate 25 mg twice  daily  Eliquis 5 mg twice daily  Will likely need outpatient monitor and echocardiogram, consider outpatient thyroid studies   Discharge Instructions  Discharge Instructions     Call MD for:  difficulty breathing, headache or visual disturbances   Complete by: As directed    Call MD for:  persistant dizziness or light-headedness   Complete by: As directed    Diet - low sodium heart healthy   Complete by: As directed    Increase activity slowly   Complete by: As directed       Allergies as of 07/24/2021       Reactions   Ibuprofen Nausea Only        Medication List     TAKE these medications    apixaban 5 MG Tabs tablet Commonly known as: ELIQUIS Take 1 tablet (5 mg total) by mouth 2 (two) times daily.   CALCIUM 600+D3 PO Take 1 tablet by mouth daily with breakfast.   fexofenadine 180 MG tablet Commonly known as: ALLEGRA Take 180 mg by mouth daily as needed (for seasonal allergies).   metoprolol tartrate 25 MG tablet Commonly known as: LOPRESSOR Take 1 tablet (25 mg total) by mouth 2 (two) times daily.   raloxifene 60 MG tablet Commonly known as: EVISTA Take 60 mg by mouth in the morning.   rosuvastatin 10 MG tablet Commonly known as: CRESTOR Take 10 mg by mouth in the morning.        Allergies  Allergen Reactions   Ibuprofen Nausea Only    Consultations: None   Procedures/Studies: DG Chest 2 View  Result Date: 07/23/2021 CLINICAL DATA:  Chest pain. EXAM: CHEST - 2 VIEW COMPARISON:  None. FINDINGS: Lungs are hyperexpanded. The lungs are clear without focal pneumonia, edema, pneumothorax or pleural effusion. The cardiopericardial silhouette is within normal limits for size. The visualized bony structures of the thorax are unremarkable. Telemetry leads overlie the chest. IMPRESSION: Hyperexpansion without acute cardiopulmonary findings. Electronically Signed   By: Evelyn [name] M.D.   On: 07/23/2021 07:35   ECHOCARDIOGRAM COMPLETE  Result Date:  07/23/2021    ECHOCARDIOGRAM REPORT   Patient Name:   Evelyn [name] Date of Exam: 07/23/2021 Medical Rec #:  [id number]          Height:       61.0 in Accession #:    [id number]         Weight:       130.0 lb Date of Birth:  [birth year]          BSA:          1.573 m Patient Age:    87 years           BP:           106/81 mmHg Patient Gender: F                  HR:           88 bpm. Exam Location:  Inpatient Procedure: 2D Echo, Cardiac Doppler and Color Doppler Indications:    R07.9 CHEST PAIN  History:        Patient has no prior history of Echocardiogram examinations.                 Risk Factors:Dyslipidemia.  Sonographer:    Beryle Beams Referring Phys: [id number] [place]  1. Left ventricular ejection fraction, by estimation, is 55 to 60%. The left ventricle has normal function. The left ventricle has no regional wall motion abnormalities. Left ventricular diastolic parameters were normal.  2. Right ventricular systolic function is normal. The right ventricular size is normal.  3. The mitral valve is normal in structure. No evidence of mitral valve regurgitation. No evidence of mitral stenosis.  4. The aortic valve is normal in structure. Aortic valve regurgitation is not visualized. No aortic stenosis is present.  5. The inferior vena cava is normal in size with greater than 50% respiratory variability, suggesting right atrial pressure of 3 mmHg. FINDINGS  Left Ventricle: Left ventricular ejection fraction, by estimation, is 55 to 60%. The left ventricle has normal function. The left ventricle has no regional wall motion abnormalities. The left ventricular internal cavity size was normal in size. There is  no left ventricular hypertrophy. Left ventricular diastolic parameters were normal. Right Ventricle: The right ventricular size is normal. No increase in right ventricular wall thickness. Right ventricular systolic function is normal. Left Atrium: Left atrial size was normal in size. Right  Atrium: Right atrial size was normal in size. Pericardium: There is no evidence of pericardial effusion. Mitral Valve: The mitral valve is normal in structure. No evidence of mitral valve regurgitation. No evidence of mitral valve stenosis. Tricuspid Valve: The tricuspid valve is normal in structure. Tricuspid valve regurgitation is not demonstrated. No evidence of tricuspid stenosis. Aortic Valve: The aortic valve is normal in structure. Aortic valve regurgitation is not visualized. No aortic stenosis is present. Aortic valve mean gradient measures 2.0 mmHg. Aortic valve peak gradient measures 4.7 mmHg. Aortic valve area, by VTI measures 2.43 cm. Pulmonic Valve: The pulmonic valve was normal  in structure. Pulmonic valve regurgitation is not visualized. No evidence of pulmonic stenosis. Aorta: The aortic root is normal in size and structure. Venous: The inferior vena cava is normal in size with greater than 50% respiratory variability, suggesting right atrial pressure of 3 mmHg. IAS/Shunts: The interatrial septum appears to be lipomatous. No atrial level shunt detected by color flow Doppler.  LEFT VENTRICLE PLAX 2D LVIDd:         4.10 cm LVIDs:         2.60 cm LV PW:         1.00 cm LV IVS:        1.00 cm LVOT diam:     1.80 cm LV SV:         57 LV SV Index:   36 LVOT Area:     2.54 cm  LV Volumes (MOD) LV vol d, MOD A2C: 39.0 ml LV vol d, MOD A4C: 31.3 ml LV vol s, MOD A2C: 12.2 ml LV vol s, MOD A4C: 14.2 ml LV SV MOD A2C:     26.8 ml LV SV MOD A4C:     31.3 ml LV SV MOD BP:      22.0 ml RIGHT VENTRICLE             IVC RV S prime:     10.90 cm/s  IVC diam: 1.20 cm TAPSE (M-mode): 1.3 cm LEFT ATRIUM             Index LA diam:        2.80 cm 1.78 cm/m LA Vol (A2C):   24.4 ml 15.51 ml/m LA Vol (A4C):   28.9 ml 18.38 ml/m LA Biplane Vol: 26.8 ml 17.04 ml/m  AORTIC VALVE                    PULMONIC VALVE AV Area (Vmax):    2.62 cm     PV Vmax:       0.52 m/s AV Area (Vmean):   2.33 cm     PV Vmean:      30.600  cm/s AV Area (VTI):     2.43 cm     PV VTI:        0.099 m AV Vmax:           108.00 cm/s  PV Peak grad:  1.1 mmHg AV Vmean:          72.400 cm/s  PV Mean grad:  0.0 mmHg AV VTI:            0.236 m AV Peak Grad:      4.7 mmHg AV Mean Grad:      2.0 mmHg LVOT Vmax:         111.00 cm/s LVOT Vmean:        66.200 cm/s LVOT VTI:          0.225 m LVOT/AV VTI ratio: 0.95  AORTA Ao Root diam: 2.20 cm MITRAL VALVE               TRICUSPID VALVE MV Area (PHT): 3.85 cm    TR Peak grad:   18.0 mmHg MV Decel Time: 197 msec    TR Mean grad:   14.0 mmHg MV E velocity: 87.40 cm/s  TR Vmax:        212.00 cm/s MV A velocity: 72.00 cm/s  TR Vmean:       178.0 cm/s MV E/A ratio:  1.21  SHUNTS                            Systemic VTI:  0.22 m                            Systemic Diam: 1.80 cm Jenkins Rouge MD Electronically signed by Jenkins Rouge MD Signature Date/Time: 07/23/2021/2:42:02 PM    Final    CT Angio Chest/Abd/Pel for Dissection W and/or W/WO  Result Date: 07/23/2021 CLINICAL DATA:  Acute aortic syndrome Acute chest pain and chest heaviness Hurts to breathe EXAM: CT ANGIOGRAPHY CHEST, ABDOMEN AND PELVIS TECHNIQUE: Non-contrast CT of the chest was initially obtained. Multidetector CT imaging through the chest, abdomen and pelvis was performed using the standard protocol during bolus administration of intravenous contrast. Multiplanar reconstructed images and MIPs were obtained and reviewed to evaluate the vascular anatomy. RADIATION DOSE REDUCTION: This exam was performed according to the departmental dose-optimization program which includes automated exposure control, adjustment of the mA and/or kV according to patient size and/or use of iterative reconstruction technique. CONTRAST:  146m OMNIPAQUE IOHEXOL 350 MG/ML SOLN COMPARISON:  None. FINDINGS: CTA CHEST FINDINGS Cardiovascular: No aortic intramural hematoma identified on precontrast images. No thoracic aortic aneurysm or dissection. Scattered  calcified atheromatous plaque is present. Heart size is within normal limits. Mediastinum/Nodes: Heterogeneous thyroid extending into the left tracheoesophageal groove is seen in better assessed by thyroid ultrasound from 11/12/2019. No enlarged mediastinal, hilar, or axillary lymph nodes. Lungs/Pleura: Severe emphysematous changes of the lungs. No focal airspace opacity to indicate pneumonia. Musculoskeletal: No chest wall abnormality. No acute or significant osseous findings. Review of the MIP images confirms the above findings. CTA ABDOMEN AND PELVIS FINDINGS VASCULAR Aorta: Diffuse atherosclerotic calcified plaque without aneurysm or flow-limiting stenosis. Celiac: Patent without evidence of aneurysm, dissection, vasculitis or significant stenosis. SMA: Patent without evidence of aneurysm, dissection, vasculitis or significant stenosis. Renals: Mild to moderate stenosis at the origin of the left main renal artery. No significant stenosis of the right main renal artery. IMA: Patent without evidence of aneurysm, dissection, vasculitis or significant stenosis. Inflow: Scattered calcified atheromatous plaque of the inflow arteries bilaterally without flow-limiting stenosis. Veins: No obvious venous abnormality within the limitations of this arterial phase study. Review of the MIP images confirms the above findings. NON-VASCULAR Hepatobiliary: No focal liver abnormality is seen. No gallstones, gallbladder wall thickening, or biliary dilatation. Pancreas: Unremarkable. No pancreatic ductal dilatation or surrounding inflammatory changes. Spleen: Normal in size without focal abnormality. Adrenals/Urinary Tract: Adrenal glands are normal. Kidneys, ureters, and bladder unremarkable. Stomach/Bowel: Mild diffuse thickening of the wall of the descending and sigmoid colon without significant adjacent pericolonic fat stranding. Appendix is normal. Lymphatic: No enlarged abdominopelvic lymph nodes. Reproductive: Status post  hysterectomy. No adnexal masses. Other: No abdominal wall hernia or abnormality. No abdominopelvic ascites. Musculoskeletal: No acute or significant osseous findings. Review of the MIP images confirms the above findings. IMPRESSION: 1. No acute abnormality of the thoracic or abdominal aorta. 2. Severe emphysematous changes the lungs. 3. Long segment, diffuse, mild thickening of the descending and sigmoid colon wall without significant adjacent pericolonic fat stranding. These findings are nonspecific, however may be due to chronic inflammation. Electronically Signed   By: FMiachel RouxM.D.   On: 07/23/2021 10:14    Discharge Exam: Vitals:   07/24/21 0446 07/24/21 0705  BP: 127/68 117/73  Pulse:    Resp:    Temp:  SpO2:      General: Pt is alert, awake, not in acute distress Cardiovascular: Reg rate, irreg irreg rhythm, S1/S2 +, no edema Respiratory: CTA bilaterally, no wheezing, no rhonchi, no respiratory distress, no conversational dyspnea  Abdominal: Soft, NT, ND, bowel sounds + Extremities: no edema, no cyanosis Psych: Normal mood and affect, stable judgement and insight     The results of significant diagnostics from this hospitalization (including imaging, microbiology, ancillary and laboratory) are listed below for reference.     Microbiology: Recent Results (from the past 240 hour(s))  Resp Panel by RT-PCR (Flu A&B, Covid) Nasopharyngeal Swab     Status: None   Collection Time: 07/23/21  5:52 AM   Specimen: Nasopharyngeal Swab; Nasopharyngeal(NP) swabs in vial transport medium  Result Value Ref Range Status   SARS Coronavirus 2 by RT PCR NEGATIVE NEGATIVE Final    Comment: (NOTE) SARS-CoV-2 target nucleic acids are NOT DETECTED.  The SARS-CoV-2 RNA is generally detectable in upper respiratory specimens during the acute phase of infection. The lowest concentration of SARS-CoV-2 viral copies this assay can detect is 138 copies/mL. A negative result does not preclude  SARS-Cov-2 infection and should not be used as the sole basis for treatment or other patient management decisions. A negative result may occur with  improper specimen collection/handling, submission of specimen other than nasopharyngeal swab, presence of viral mutation(s) within the areas targeted by this assay, and inadequate number of viral copies(<138 copies/mL). A negative result must be combined with clinical observations, patient history, and epidemiological information. The expected result is Negative.  Fact Sheet for Patients:  EntrepreneurPulse.com.au  Fact Sheet for Healthcare Providers:  IncredibleEmployment.be  This test is no t yet approved or cleared by the [place] FDA and  has been authorized for detection and/or diagnosis of SARS-CoV-2 by FDA under an Emergency Use Authorization (EUA). This EUA will remain  in effect (meaning this test can be used) for the duration of the COVID-19 declaration under Section 564(b)(1) of the Act, 21 U.S.C.section 360bbb-3(b)(1), unless the authorization is terminated  or revoked sooner.       Influenza A by PCR NEGATIVE NEGATIVE Final   Influenza B by PCR NEGATIVE NEGATIVE Final    Comment: (NOTE) The Xpert Xpress SARS-CoV-2/FLU/RSV plus assay is intended as an aid in the diagnosis of influenza from Nasopharyngeal swab specimens and should not be used as a sole basis for treatment. Nasal washings and aspirates are unacceptable for Xpert Xpress SARS-CoV-2/FLU/RSV testing.  Fact Sheet for Patients: EntrepreneurPulse.com.au  Fact Sheet for Healthcare Providers: IncredibleEmployment.be  This test is not yet approved or cleared by the [place] FDA and has been authorized for detection and/or diagnosis of SARS-CoV-2 by FDA under an Emergency Use Authorization (EUA). This EUA will remain in effect (meaning this test can be used) for the duration of  the COVID-19 declaration under Section 564(b)(1) of the Act, 21 U.S.C. section 360bbb-3(b)(1), unless the authorization is terminated or revoked.  Performed at [facility], [place] [address] [place], [place] [address]      Labs: Basic Metabolic Panel: Recent Labs  Lab 07/23/21 0620  NA 139  K 3.5  CL 104  CO2 26  GLUCOSE 89  BUN 14  CREATININE 0.72  CALCIUM 8.9   CBC: Recent Labs  Lab 07/23/21 0620  WBC 9.4  NEUTROABS 7.2  HGB 13.2  HCT 39.8  MCV 94.8  PLT 240   CBG: Recent Labs  Lab 07/23/21 2014  GLUCAP 97  D-Dimer Recent Labs    07/23/21 0620  DDIMER <0.27   Lipid Profile Recent Labs    07/24/21 0427  CHOL 153  HDL 78  LDLCALC 55  TRIG 99  CHOLHDL 2.0   Microbiology Recent Results (from the past 240 hour(s))  Resp Panel by RT-PCR (Flu A&B, Covid) Nasopharyngeal Swab     Status: None   Collection Time: 07/23/21  5:52 AM   Specimen: Nasopharyngeal Swab; Nasopharyngeal(NP) swabs in vial transport medium  Result Value Ref Range Status   SARS Coronavirus 2 by RT PCR NEGATIVE NEGATIVE Final    Comment: (NOTE) SARS-CoV-2 target nucleic acids are NOT DETECTED.  The SARS-CoV-2 RNA is generally detectable in upper respiratory specimens during the acute phase of infection. The lowest concentration of SARS-CoV-2 viral copies this assay can detect is 138 copies/mL. A negative result does not preclude SARS-Cov-2 infection and should not be used as the sole basis for treatment or other patient management decisions. A negative result may occur with  improper specimen collection/handling, submission of specimen other than nasopharyngeal swab, presence of viral mutation(s) within the areas targeted by this assay, and inadequate number of viral copies(<138 copies/mL). A negative result must be combined with clinical observations, patient history, and epidemiological information. The expected result is Negative.  Fact Sheet for  Patients:  EntrepreneurPulse.com.au  Fact Sheet for Healthcare Providers:  IncredibleEmployment.be  This test is no t yet approved or cleared by the [place] FDA and  has been authorized for detection and/or diagnosis of SARS-CoV-2 by FDA under an Emergency Use Authorization (EUA). This EUA will remain  in effect (meaning this test can be used) for the duration of the COVID-19 declaration under Section 564(b)(1) of the Act, 21 U.S.C.section 360bbb-3(b)(1), unless the authorization is terminated  or revoked sooner.       Influenza A by PCR NEGATIVE NEGATIVE Final   Influenza B by PCR NEGATIVE NEGATIVE Final    Comment: (NOTE) The Xpert Xpress SARS-CoV-2/FLU/RSV plus assay is intended as an aid in the diagnosis of influenza from Nasopharyngeal swab specimens and should not be used as a sole basis for treatment. Nasal washings and aspirates are unacceptable for Xpert Xpress SARS-CoV-2/FLU/RSV testing.  Fact Sheet for Patients: EntrepreneurPulse.com.au  Fact Sheet for Healthcare Providers: IncredibleEmployment.be  This test is not yet approved or cleared by the [place] FDA and has been authorized for detection and/or diagnosis of SARS-CoV-2 by FDA under an Emergency Use Authorization (EUA). This EUA will remain in effect (meaning this test can be used) for the duration of the COVID-19 declaration under Section 564(b)(1) of the Act, 21 U.S.C. section 360bbb-3(b)(1), unless the authorization is terminated or revoked.  Performed at [facility], [place] [address] [place], [place] [address]      Patient was seen and examined on the day of discharge and was found to be in stable condition. Time coordinating discharge: 45 minutes including assessment and coordination of care, as well as examination of the patient.   SIGNED:  Shelda Pal, DO [facility] 07/24/2021,  3:26 PM

## 2021-07-24 NOTE — Discharge Instructions (Addendum)
You were cared for by a hospitalist during your hospital stay. If you have any questions about your discharge medications or the care you received while you were in the hospital after you are discharged, you can call the unit and ask to speak with the hospitalist on call if the hospitalist that took care of you is not available. Once you are discharged, your primary care physician will handle any further medical issues. Please note that NO REFILLS for any discharge medications will be authorized once you are discharged, as it is imperative that you return to your primary care physician (or establish a relationship with a primary care physician if you do not have one) for your aftercare needs so that they can reassess your need for medications and monitor your lab values. ?_____________________________________________ ?Information on my medicine - ELIQUIS? (apixaban) ? ?This medication education was reviewed with me or my healthcare representative as part of my discharge preparation.   ? ?Why was Eliquis? prescribed for you? ?Eliquis? was prescribed for you to reduce the risk of a blood clot forming that can cause a stroke if you have a medical condition called atrial fibrillation (a type of irregular heartbeat). ? ?What do You need to know about Eliquis? ? ?Take your Eliquis? TWICE DAILY - one tablet in the morning and one tablet in the evening with or without food. If you have difficulty swallowing the tablet whole please discuss with your pharmacist how to take the medication safely. ? ?Take Eliquis? exactly as prescribed by your doctor and DO NOT stop taking Eliquis? without talking to the doctor who prescribed the medication.  Stopping may increase your risk of developing a stroke.  Refill your prescription before you run out. ? ?After discharge, you should have regular check-up appointments with your healthcare provider that is prescribing your Eliquis?.  In the future your dose may need to be changed if your  kidney function or weight changes by a significant amount or as you get older. ? ?What do you do if you miss a dose? ?If you miss a dose, take it as soon as you remember on the same day and resume taking twice daily.  Do not take more than one dose of ELIQUIS at the same time to make up a missed dose. ? ?Important Safety Information ?A possible side effect of Eliquis? is bleeding. You should call your healthcare provider right away if you experience any of the following: ?Bleeding from an injury or your nose that does not stop. ?Unusual colored urine (red or dark brown) or unusual colored stools (red or black). ?Unusual bruising for unknown reasons. ?A serious fall or if you hit your head (even if there is no bleeding). ? ?Some medicines may interact with Eliquis? and might increase your risk of bleeding or clotting while on Eliquis?Marland Kitchen To help avoid this, consult your healthcare provider or pharmacist prior to using any new prescription or non-prescription medications, including herbals, vitamins, non-steroidal anti-inflammatory drugs (NSAIDs) and supplements. ? ?This website has more information on Eliquis? (apixaban): http://www.eliquis.com/eliquis/home  ?

## 2021-07-26 DIAGNOSIS — I4891 Unspecified atrial fibrillation: Secondary | ICD-10-CM | POA: Diagnosis not present

## 2021-07-26 DIAGNOSIS — R079 Chest pain, unspecified: Secondary | ICD-10-CM | POA: Diagnosis not present

## 2021-08-01 NOTE — Progress Notes (Unsigned)
Cardiology Office Note:    Date:  08/01/2021   ID:  Evelyn [name], DOB 03-05-1951, MRN [id number]  PCP:  Maury Dus, MD  Cardiologist:  None  Electrophysiologist:  None   Referring MD: Maury Dus, MD   No chief complaint on file. ***  History of Present Illness:    Evelyn [name] is a 71 y.o. female with a hx of hyperlipidemia, paroxysmal atrial fibrillation who is referred by Dr. Alyson Ingles for evaluation of atrial fibrillation.  She was admitted from 3/3 through 07/24/2021 with chest pain.  Troponins negative.  She was found to have paroxysmal atrial fibrillation while admitted.  Echocardiogram 07/23/2021 showed normal biventricular function, no significant valvular disease.  Started on metoprolol and Eliquis.  Since discharge from the hospital,  Past Medical History:  Diagnosis Date   Allergy    seasonal   Cataract    bilateral removed   Hyperlipidemia    Osteopenia    Osteoporosis     Past Surgical History:  Procedure Laterality Date   ABDOMINAL HYSTERECTOMY     CARPAL TUNNEL RELEASE Bilateral    CATARACT EXTRACTION Bilateral    CESAREAN SECTION     x2   COLONOSCOPY     UTERINE FIBROID SURGERY  1999    Current Medications: No outpatient medications have been marked as taking for the 08/05/21 encounter (Appointment) with Donato Heinz, MD.     Allergies:   Ibuprofen   Social History   Socioeconomic History   Marital status: Married    Spouse name: Not on file   Number of children: Not on file   Years of education: Not on file   Highest education level: Not on file  Occupational History   Not on file  Tobacco Use   Smoking status: Former   Smokeless tobacco: Never  Vaping Use   Vaping Use: Never used  Substance and Sexual Activity   Alcohol use: Not Currently    Comment: occa   Drug use: No   Sexual activity: Not on file  Other Topics Concern   Not on file  Social History Narrative   Not on file   Social Determinants of Health    Financial Resource Strain: Not on file  Food Insecurity: Not on file  Transportation Needs: Not on file  Physical Activity: Not on file  Stress: Not on file  Social Connections: Not on file     Family History: The patient's ***family history is negative for Colon cancer, Colon polyps, Esophageal cancer, Rectal cancer, and Stomach cancer.  ROS:   Please see the history of present illness.    *** All other systems reviewed and are negative.  EKGs/Labs/Other Studies Reviewed:    The following studies were reviewed today: ***  EKG:  EKG is *** ordered today.  The ekg ordered today demonstrates ***  Recent Labs: 07/23/2021: BUN 14; Creatinine, Ser 0.72; Hemoglobin 13.2; Platelets 240; Potassium 3.5; Sodium 139  Recent Lipid Panel    Component Value Date/Time   CHOL 153 07/24/2021 0427   TRIG 99 07/24/2021 0427   HDL 78 07/24/2021 0427   CHOLHDL 2.0 07/24/2021 0427   VLDL 20 07/24/2021 0427   LDLCALC 55 07/24/2021 0427    Physical Exam:    VS:  There were no vitals taken for this visit.    Wt Readings from Last 3 Encounters:  07/23/21 130 lb (59 kg)  09/16/20 131 lb (59.4 kg)  09/02/20 131 lb (59.4 kg)  GEN: *** Well nourished, well developed in no acute distress HEENT: Normal NECK: No JVD; No carotid bruits LYMPHATICS: No lymphadenopathy CARDIAC: ***RRR, no murmurs, rubs, gallops RESPIRATORY:  Clear to auscultation without rales, wheezing or rhonchi  ABDOMEN: Soft, non-tender, non-distended MUSCULOSKELETAL:  No edema; No deformity  SKIN: Warm and dry NEUROLOGIC:  Alert and oriented x 3 PSYCHIATRIC:  Normal affect   ASSESSMENT:    No diagnosis found. PLAN:     Chest pain:  Paroxysmal atrial fibrillation: CHA2DS2-VASc score 2 (age, female). -Continue Eliquis 5 mg twice daily -Continue metoprolol 25 mg twice daily -Echocardiogram -TSH  RTC in***   Medication Adjustments/Labs and Tests Ordered: Current medicines are reviewed at length with the  patient today.  Concerns regarding medicines are outlined above.  No orders of the defined types were placed in this encounter.  No orders of the defined types were placed in this encounter.   There are no Patient Instructions on file for this visit.   Signed, Donato Heinz, MD  08/01/2021 6:08 PM    [place]

## 2021-08-05 ENCOUNTER — Other Ambulatory Visit: Payer: Self-pay

## 2021-08-05 ENCOUNTER — Ambulatory Visit: Payer: Medicare Other | Admitting: Cardiology

## 2021-08-05 ENCOUNTER — Ambulatory Visit (INDEPENDENT_AMBULATORY_CARE_PROVIDER_SITE_OTHER): Payer: Medicare Other

## 2021-08-05 VITALS — BP 134/67 | HR 74 | Ht 61.0 in | Wt 132.4 lb

## 2021-08-05 DIAGNOSIS — R079 Chest pain, unspecified: Secondary | ICD-10-CM | POA: Diagnosis not present

## 2021-08-05 DIAGNOSIS — I4891 Unspecified atrial fibrillation: Secondary | ICD-10-CM | POA: Diagnosis not present

## 2021-08-05 DIAGNOSIS — I480 Paroxysmal atrial fibrillation: Secondary | ICD-10-CM | POA: Diagnosis not present

## 2021-08-05 DIAGNOSIS — E785 Hyperlipidemia, unspecified: Secondary | ICD-10-CM | POA: Diagnosis not present

## 2021-08-05 NOTE — Progress Notes (Unsigned)
Enrolled patient for a 14 day Zio XT  monitor to be mailed to patients home  °

## 2021-08-05 NOTE — Patient Instructions (Signed)
Medication Instructions:  ?Your physician recommends that you continue on your current medications as directed. Please refer to the Current Medication list given to you today. ? ?*If you need a refill on your cardiac medications before your next appointment, please call your pharmacy* ? ? ?Lab Work: ?BMET, TSH today ? ?If you have labs (blood work) drawn today and your tests are completely normal, you will receive your results only by: ?MyChart Message (if you have MyChart) OR ?A paper copy in the mail ?If you have any lab test that is abnormal or we need to change your treatment, we will call you to review the results. ? ? ?Testing/Procedures: ?Coronary CTA-see instructions below ? ?ZIO XT- Long Term Monitor Instructions  ? ?Your physician has requested you wear a ZIO patch monitor for _14_ days.  ?This is a single patch monitor.   [employer] supplies one patch monitor per enrollment. Additional stickers are not available. Please do not apply patch if you will be having a Nuclear Stress Test, Echocardiogram, Cardiac CT, MRI, or Chest Xray during the period you would be wearing the monitor. The patch cannot be worn during these tests. You cannot remove and re-apply the ZIO XT patch monitor.  ?Your ZIO patch monitor will be sent Fed Ex from [employer] directly to your home address. It may take 3-5 days to receive your monitor after you have been enrolled.  ?Once you have received your monitor, please review the enclosed instructions. Your monitor has already been registered assigning a specific monitor serial # to you. ? ?Billing and Patient Assistance Program Information  ? ?We have supplied [employer] with any of your insurance information on file for billing purposes. ?[employer] offers a sliding scale Patient Assistance Program for patients that do not have insurance, or whose insurance does not completely cover the cost of the ZIO monitor.   You must apply for the Patient Assistance Program to qualify for this  discounted rate.     To apply, please call [employer] at [phone], select option 4, then select option 2, and ask to apply for Patient Assistance Program.  Theodore Demark will ask your household income, and how many people are in your household.  They will quote your out-of-pocket cost based on that information.  [employer] will also be able to set up a 87-month interest-free payment plan if needed. ? ?Applying the monitor  ? ?Shave hair from upper left chest.  ?Hold abrader disc by orange tab. Rub abrader in 40 strokes over the upper left chest as indicated in your monitor instructions.  ?Clean area with 4 enclosed alcohol pads. Let dry.  ?Apply patch as indicated in monitor instructions. Patch will be placed under collarbone on left side of chest with arrow pointing upward.  ?Rub patch adhesive wings for 2 minutes. Remove white label marked "1". Remove the white label marked "2". Rub patch adhesive wings for 2 additional minutes.  ?While looking in a mirror, press and release button in center of patch. A small green light will flash 3-4 times. This will be your only indicator that the monitor has been turned on. ?  ?Do not shower for the first 24 hours. You may shower after the first 24 hours.  ?Press the button if you feel a symptom. You will hear a small click. Record Date, Time and Symptom in the Patient Logbook.  ?When you are ready to remove the patch, follow instructions on the last 2 pages of the Patient Logbook. Stick patch monitor onto the  last page of Patient Logbook.  ?Place Patient Logbook in the blue and white box.  Use locking tab on box and tape box closed securely.  The blue and white box has prepaid postage on it. Please place it in the mailbox as soon as possible. Your physician should have your test results approximately 7 days after the monitor has been mailed back to [facility].  ?Call [facility] at [phone] if you have questions regarding your ZIO XT patch monitor. Call  them immediately if you see an orange light blinking on your monitor.  ?If your monitor falls off in less than 4 days, contact our Monitor department at [phone]. ?If your monitor becomes loose or falls off after 4 days call [employer] at [phone] for suggestions on securing your monitor.? ? ?Follow-Up: ?At [facility], you and your health needs are our priority.  As part of our continuing mission to provide you with exceptional heart care, we have created designated Provider Care Teams.  These Care Teams include your primary Cardiologist (physician) and Advanced Practice Providers (APPs -  Physician Assistants and Nurse Practitioners) who all work together to provide you with the care you need, when you need it. ? ?We recommend signing up for the patient portal called "MyChart".  Sign up information is provided on this After Visit Summary.  MyChart is used to connect with patients for Virtual Visits (Telemedicine).  Patients are able to view lab/test results, encounter notes, upcoming appointments, etc.  Non-urgent messages can be sent to your provider as well.   ?To learn more about what you can do with MyChart, go to [email or web address].   ? ?Your next appointment:   ?6 month(s) ? ?The format for your next appointment:   ?In Person ? ?Provider:   ?Donato Heinz, MD { ? ? ? ?Other Instructions ? ? ?Your cardiac CT will be scheduled at one of the below locations:  ? ?[facility] ?[address] ?[place], [place] [address] ?[phone] ? ?If scheduled at [facility], please arrive at the [facility] and Children's Entrance (Entrance C2) of [facility] 30 minutes prior to test start time. ?You can use the FREE valet parking offered at entrance C (encouraged to control the heart rate for the test)  ?Proceed to the [facility] Radiology Department (first floor) to check-in and test prep. ? ?All radiology patients and guests should use entrance C2 at [facility],  accessed from [facility], even though the hospital's physical address listed is [address] ? ? ? ? ?Please follow these instructions carefully (unless otherwise directed): ? ? ?On the Night Before the Test: ?Be sure to Drink plenty of water. ?Do not consume any caffeinated/decaffeinated beverages or chocolate 12 hours prior to your test. ?Do not take any antihistamines 12 hours prior to your test. ? ?On the Day of the Test: ?Drink plenty of water until 1 hour prior to the test. ?Do not eat any food 4 hours prior to the test. ?You may take your regular medications prior to the test.  ?Take metoprolol (Lopressor) 50 mg two hours prior to test. ?FEMALES- please wear underwire-free bra if available, avoid dresses & tight clothing ?     ?After the Test: ?Drink plenty of water. ?After receiving IV contrast, you may experience a mild flushed feeling. This is normal. ?On occasion, you may experience a mild rash up to 24 hours after the test. This is not dangerous. If this occurs,  you can take Benadryl 25 mg and increase your fluid intake. ?If you experience trouble breathing, this can be serious. If it is severe call 911 IMMEDIATELY. If it is mild, please call our office. ?If you take any of these medications: Glipizide/Metformin, Avandament, Glucavance, please do not take 48 hours after completing test unless otherwise instructed. ? ?We will call to schedule your test 2-4 weeks out understanding that some insurance companies will need an authorization prior to the service being performed.  ? ?For non-scheduling related questions, please contact the cardiac imaging nurse navigator should you have any questions/concerns: ?Marchia Bond, Cardiac Imaging Nurse Navigator ?Gordy Clement, Cardiac Imaging Nurse Navigator ?[facility] ?Direct Office Dial: [phone]  ? ?For scheduling needs, including cancellations and rescheduling, please call [place], [phone]. ? ? ?

## 2021-08-06 ENCOUNTER — Encounter: Payer: Self-pay | Admitting: *Deleted

## 2021-08-06 LAB — BASIC METABOLIC PANEL
BUN/Creatinine Ratio: 13 (ref 12–28)
BUN: 12 mg/dL (ref 8–27)
CO2: 19 mmol/L — ABNORMAL LOW (ref 20–29)
Calcium: 9.1 mg/dL (ref 8.7–10.3)
Chloride: 105 mmol/L (ref 96–106)
Creatinine, Ser: 0.9 mg/dL (ref 0.57–1.00)
Glucose: 95 mg/dL (ref 70–99)
Potassium: 4.1 mmol/L (ref 3.5–5.2)
Sodium: 143 mmol/L (ref 134–144)
eGFR: 70 mL/min/{1.73_m2} (ref >59)

## 2021-08-06 LAB — TSH: TSH: 0.758 u[IU]/mL (ref 0.450–4.500)

## 2021-08-09 DIAGNOSIS — I4891 Unspecified atrial fibrillation: Secondary | ICD-10-CM | POA: Diagnosis not present

## 2021-08-16 DIAGNOSIS — E7800 Pure hypercholesterolemia, unspecified: Secondary | ICD-10-CM | POA: Diagnosis not present

## 2021-08-17 ENCOUNTER — Telehealth: Payer: Self-pay | Admitting: Cardiology

## 2021-08-17 NOTE — Telephone Encounter (Signed)
Patient will have worn her monitor 10 out of 14 days by her CT appointment on Thursday.  She was advised that you cannot remove a ZIO patch monitor and reapply it.  The patients choices would be to reschedule her CT or remove the monitor after 10 days and mail it back for processing. ?The patient has chosen to remove her ZIO patch after 10 days on 08/19/21 and mail it back to [facility] for processing. ? ?

## 2021-08-17 NOTE — Telephone Encounter (Signed)
New Message: ? ? ? ? ?Patient is wearing a Monitor. She is scheduled for a CT Morph on Thursday. She wants to know if twill be alright for her to take the Monitor off, while she is having the test?a ?

## 2021-08-18 ENCOUNTER — Telehealth (HOSPITAL_COMMUNITY): Payer: Self-pay | Admitting: Emergency Medicine

## 2021-08-18 DIAGNOSIS — Z6824 Body mass index (BMI) 24.0-24.9, adult: Secondary | ICD-10-CM | POA: Diagnosis not present

## 2021-08-18 DIAGNOSIS — M5432 Sciatica, left side: Secondary | ICD-10-CM | POA: Diagnosis not present

## 2021-08-18 DIAGNOSIS — Z0000 Encounter for general adult medical examination without abnormal findings: Secondary | ICD-10-CM | POA: Diagnosis not present

## 2021-08-18 DIAGNOSIS — J309 Allergic rhinitis, unspecified: Secondary | ICD-10-CM | POA: Diagnosis not present

## 2021-08-18 DIAGNOSIS — E041 Nontoxic single thyroid nodule: Secondary | ICD-10-CM | POA: Diagnosis not present

## 2021-08-18 DIAGNOSIS — R0989 Other specified symptoms and signs involving the circulatory and respiratory systems: Secondary | ICD-10-CM | POA: Diagnosis not present

## 2021-08-18 DIAGNOSIS — E7800 Pure hypercholesterolemia, unspecified: Secondary | ICD-10-CM | POA: Diagnosis not present

## 2021-08-18 DIAGNOSIS — M8588 Other specified disorders of bone density and structure, other site: Secondary | ICD-10-CM | POA: Diagnosis not present

## 2021-08-18 DIAGNOSIS — Z1389 Encounter for screening for other disorder: Secondary | ICD-10-CM | POA: Diagnosis not present

## 2021-08-18 DIAGNOSIS — K219 Gastro-esophageal reflux disease without esophagitis: Secondary | ICD-10-CM | POA: Diagnosis not present

## 2021-08-18 DIAGNOSIS — I480 Paroxysmal atrial fibrillation: Secondary | ICD-10-CM | POA: Diagnosis not present

## 2021-08-18 NOTE — Telephone Encounter (Signed)
Attempted to call patient regarding upcoming cardiac CT appointment. °Left message on voicemail with name and callback number °Annemarie Sebree RN Navigator Cardiac Imaging °[facility] °[phone] Office °[phone] Cell ° °

## 2021-08-19 ENCOUNTER — Ambulatory Visit (HOSPITAL_COMMUNITY)
Admission: RE | Admit: 2021-08-19 | Discharge: 2021-08-19 | Disposition: A | Discharge status: Home / Self Care | Payer: Medicare Other | Source: Ambulatory Visit | Attending: Cardiology | Admitting: Cardiology

## 2021-08-19 ENCOUNTER — Ambulatory Visit (HOSPITAL_BASED_OUTPATIENT_CLINIC_OR_DEPARTMENT_OTHER)
Admission: RE | Admit: 2021-08-19 | Discharge: 2021-08-19 | Disposition: A | Discharge status: Home / Self Care | Payer: Medicare Other | Source: Ambulatory Visit | Attending: Cardiology | Admitting: Cardiology

## 2021-08-19 ENCOUNTER — Other Ambulatory Visit: Payer: Self-pay | Admitting: Cardiology

## 2021-08-19 DIAGNOSIS — R079 Chest pain, unspecified: Secondary | ICD-10-CM

## 2021-08-19 DIAGNOSIS — I700 Atherosclerosis of aorta: Secondary | ICD-10-CM | POA: Diagnosis not present

## 2021-08-19 DIAGNOSIS — I2510 Atherosclerotic heart disease of native coronary artery without angina pectoris: Secondary | ICD-10-CM | POA: Insufficient documentation

## 2021-08-19 DIAGNOSIS — R931 Abnormal findings on diagnostic imaging of heart and coronary circulation: Secondary | ICD-10-CM

## 2021-08-19 MED ORDER — IOHEXOL 350 MG/ML SOLN
95.0000 mL | Freq: Once | INTRAVENOUS | Status: AC | PRN
  Administered 2021-08-19: 95 mL via INTRAVENOUS

## 2021-08-19 MED ORDER — NITROGLYCERIN 0.4 MG SL SUBL
SUBLINGUAL_TABLET | SUBLINGUAL | Status: AC
  Filled 2021-08-19: qty 2

## 2021-08-19 MED ORDER — NITROGLYCERIN 0.4 MG SL SUBL
0.8000 mg | SUBLINGUAL_TABLET | Freq: Once | SUBLINGUAL | Status: AC
Start: 2021-08-19 — End: 2021-08-19
  Administered 2021-08-19: 0.8 mg via SUBLINGUAL

## 2021-08-20 ENCOUNTER — Other Ambulatory Visit: Payer: Self-pay | Admitting: Pharmacist

## 2021-08-20 ENCOUNTER — Other Ambulatory Visit: Payer: Self-pay | Admitting: *Deleted

## 2021-08-20 ENCOUNTER — Telehealth: Payer: Self-pay | Admitting: Cardiology

## 2021-08-20 DIAGNOSIS — I480 Paroxysmal atrial fibrillation: Secondary | ICD-10-CM

## 2021-08-20 DIAGNOSIS — R079 Chest pain, unspecified: Secondary | ICD-10-CM

## 2021-08-20 DIAGNOSIS — R911 Solitary pulmonary nodule: Secondary | ICD-10-CM

## 2021-08-20 MED ORDER — METOPROLOL TARTRATE 25 MG PO TABS: 25.0000 mg | ORAL_TABLET | Freq: Two times a day (BID) | ORAL | 5 refills | Status: DC

## 2021-08-20 MED ORDER — APIXABAN 5 MG PO TABS: 5.0000 mg | ORAL_TABLET | Freq: Two times a day (BID) | ORAL | 1 refills | Status: DC

## 2021-08-20 NOTE — Telephone Encounter (Signed)
Patient walked into clinic.  Needs refills on Eliquis and metoprolol. Seen by Dr Gardiner Rhyme on 08/05/21 ?

## 2021-08-20 NOTE — Telephone Encounter (Signed)
? ? ?*  STAT* If patient is at the pharmacy, call can be transferred to refill team. ? ? ?1. Which medications need to be refilled? (please list name of each medication and dose if known)  ? metoprolol tartrate (LOPRESSOR) 25 MG tablet  ? ? ?2. Which pharmacy/location (including street and city if local pharmacy) is medication to be sent to?[id number] [place] - [address]? ?3. Do they need a 30 day or 90 day supply? 90 days ? ?Pt said she is no longer using [facility] and request to be deleted on her file. She requested to res[facility] to [id number] [place], [place] - [address]

## 2021-08-23 ENCOUNTER — Other Ambulatory Visit: Payer: Self-pay

## 2021-08-23 DIAGNOSIS — I480 Paroxysmal atrial fibrillation: Secondary | ICD-10-CM

## 2021-08-23 DIAGNOSIS — R079 Chest pain, unspecified: Secondary | ICD-10-CM

## 2021-08-23 MED ORDER — METOPROLOL TARTRATE 25 MG PO TABS: 25.0000 mg | ORAL_TABLET | Freq: Two times a day (BID) | ORAL | 3 refills | Status: DC

## 2021-08-23 NOTE — Telephone Encounter (Signed)
Let pt's husband know that prescription was sent. Jeneen Rinks verbalized understanding and thanked me for this call. ?

## 2021-08-25 DIAGNOSIS — I4891 Unspecified atrial fibrillation: Secondary | ICD-10-CM | POA: Diagnosis not present

## 2021-09-01 ENCOUNTER — Telehealth: Payer: Self-pay | Admitting: Cardiology

## 2021-09-01 NOTE — Telephone Encounter (Signed)
Pt returned call to Hilda Blades about her Heart Monitor results  ? ?Best number [phone] ?

## 2021-09-01 NOTE — Telephone Encounter (Signed)
Patient made aware of results and verbalized her understanding. She would like to know if she needs to stay on the Eliquis and Metoprolol. She has been made aware that she did have a few episodes of fast heart rate. She would still like to have Dr. Newman Nickels recommendations on staying on the Eliquis and Metoprolol.  ? ? Donato Heinz, MD  ?08/31/2021  7:34 AM EDT   ?  ?No atrial fibrillation seen, but had few short episodes of fast heart rhythm from top and bottom chamber of the heart.  Given no structural heart disease on echocardiogram recently, no further work-up recommended at this time  ? ?

## 2021-09-02 NOTE — Telephone Encounter (Signed)
Yes would recommend continuing Eliquis and metoprolol ?

## 2021-09-03 NOTE — Telephone Encounter (Signed)
Called patient, gave response from MD.  Patient verbalized understanding.   

## 2021-09-20 ENCOUNTER — Telehealth: Payer: Self-pay | Admitting: Cardiology

## 2021-09-20 NOTE — Telephone Encounter (Signed)
Pt c/o medication issue: ? ?1. Name of Medication: apixaban (ELIQUIS) 5 MG TABS tablet ? ?metoprolol tartrate (LOPRESSOR) 25 MG tablet ? ?2. How are you currently taking this medication (dosage and times per day)? Take 1 tablet (5 mg total) by mouth 2 (two) times daily. ? ?Take 1 tablet (25 mg total) by mouth 2 (two) times daily. ? ?3. Are you having a reaction (difficulty breathing--STAT)? no ? ?4. What is your medication issue? Calling to see why she is still on these medication if everything is normal. ? ?

## 2021-09-20 NOTE — Telephone Encounter (Signed)
Spoke to patient she was told all her heart test were normal.She wanted to know why she has to keep taking Eliquis and Metoprolol.Explained to patient reasons for taking Eliquis and Metoprolol.She still wanted to ask Dr.Schumann if he wanted her to keep taking. ?

## 2021-09-21 NOTE — Telephone Encounter (Signed)
Patient aware and verbalized understanding,

## 2021-09-21 NOTE — Telephone Encounter (Signed)
Yes given the history of atrial fibrillation, should continue to take Eliquis and metoprolol ?

## 2021-11-25 ENCOUNTER — Other Ambulatory Visit: Payer: Self-pay | Admitting: Family Medicine

## 2021-11-25 DIAGNOSIS — Z1231 Encounter for screening mammogram for malignant neoplasm of breast: Secondary | ICD-10-CM

## 2022-01-10 ENCOUNTER — Ambulatory Visit
Admission: RE | Admit: 2022-01-10 | Discharge: 2022-01-10 | Disposition: A | Discharge status: Home / Self Care | Payer: Medicare Other | Source: Ambulatory Visit | Attending: Family Medicine | Admitting: Family Medicine

## 2022-01-10 DIAGNOSIS — Z1231 Encounter for screening mammogram for malignant neoplasm of breast: Secondary | ICD-10-CM

## 2022-01-30 NOTE — Progress Notes (Unsigned)
Cardiology Office Note:    Date:  01/31/2022   ID:  Evelyn [name], DOB 18-Feb-1951, MRN [id number]  PCP:  Antony Contras, MD  Cardiologist:  Donato Heinz, MD  Electrophysiologist:  None   Referring MD: Maury Dus, MD   Chief Complaint  Patient presents with   Coronary Artery Disease    History of Present Illness:    Evelyn [name] is a 71 y.o. female with a hx of hyperlipidemia, paroxysmal atrial fibrillation who presents for follow-up.  She was referred by Dr. Alyson Ingles for evaluation of atrial fibrillation, initially seen 08/05/2021.  She was admitted from 3/3 through 07/24/2021 with chest pain.  Troponins negative.  She was found to have paroxysmal atrial fibrillation while admitted.  Echocardiogram 07/23/2021 showed normal biventricular function, no significant valvular disease.  Started on metoprolol and Eliquis.  Coronary CTA on 08/19/2021 showed nonobstructive CAD, calcium score 690 (96 percentile).  Zio patch x10 days on 08/25/2021 showed 1 episode of NSVT lasting 10 beats, 16 episodes of SVT with longest lasting 19 beats.  Since last clinic visit, she reports that she has been doing well.  Denies any chest pain, dyspnea, lightheadedness, syncope, lower extremity edema, or palpitations.  Works out at gym 1.5 to 2 hours 3 days/week, does elliptical, treadmill, and lift weights.  No exertional symptoms.  She is taking Eliquis, denies any bleeding issues.   Past Medical History:  Diagnosis Date   Allergy    seasonal   Cataract    bilateral removed   Hyperlipidemia    Osteopenia    Osteoporosis     Past Surgical History:  Procedure Laterality Date   ABDOMINAL HYSTERECTOMY     CARPAL TUNNEL RELEASE Bilateral    CATARACT EXTRACTION Bilateral    CESAREAN SECTION     x2   COLONOSCOPY     UTERINE FIBROID SURGERY  1999    Current Medications: Current Meds  Medication Sig   apixaban (ELIQUIS) 5 MG TABS tablet Take 1 tablet (5 mg total) by mouth 2 (two) times  daily.   Calcium Carb-Cholecalciferol (CALCIUM 600+D3 PO) Take 1 tablet by mouth daily with breakfast.   famotidine (PEPCID) 40 MG tablet 1 tablet   fexofenadine (ALLEGRA) 180 MG tablet Take 180 mg by mouth daily as needed (for seasonal allergies).   metoprolol tartrate (LOPRESSOR) 25 MG tablet Take 1 tablet (25 mg total) by mouth 2 (two) times daily.   omeprazole (PRILOSEC) 40 MG capsule Take 40 mg by mouth every morning.   raloxifene (EVISTA) 60 MG tablet Take 60 mg by mouth in the morning.   rosuvastatin (CRESTOR) 10 MG tablet Take 10 mg by mouth in the morning.     Allergies:   Ibuprofen   Social History   Socioeconomic History   Marital status: Married    Spouse name: Not on file   Number of children: Not on file   Years of education: Not on file   Highest education level: Not on file  Occupational History   Not on file  Tobacco Use   Smoking status: Former   Smokeless tobacco: Never  Vaping Use   Vaping Use: Never used  Substance and Sexual Activity   Alcohol use: Not Currently    Comment: occa   Drug use: No   Sexual activity: Not on file  Other Topics Concern   Not on file  Social History Narrative   Not on file   Social Determinants of Health   Financial Resource  Strain: Not on file  Food Insecurity: Not on file  Transportation Needs: Not on file  Physical Activity: Not on file  Stress: Not on file  Social Connections: Not on file     Family History: The patient's family history is negative for Colon cancer, Colon polyps, Esophageal cancer, Rectal cancer, and Stomach cancer.  ROS:   Please see the history of present illness.     All other systems reviewed and are negative.  EKGs/Labs/Other Studies Reviewed:    The following studies were reviewed today:   EKG:   01/31/22: NSR, rate 67, no ST abnormalities 07/26/21: Normal sinus rhythm, rate 68, no ST abnormalities  Recent Labs: 07/23/2021: Hemoglobin 13.2; Platelets 240 08/05/2021: BUN 12;  Creatinine, Ser 0.90; Potassium 4.1; Sodium 143; TSH 0.758  Recent Lipid Panel    Component Value Date/Time   CHOL 153 07/24/2021 0427   TRIG 99 07/24/2021 0427   HDL 78 07/24/2021 0427   CHOLHDL 2.0 07/24/2021 0427   VLDL 20 07/24/2021 0427   LDLCALC 55 07/24/2021 0427    Physical Exam:    VS:  BP 136/60   Pulse 67   Ht '5\' 1"'$  (1.549 m)   Wt 141 lb 3.2 oz (64 kg)   SpO2 99%   BMI 26.68 kg/m     Wt Readings from Last 3 Encounters:  01/31/22 141 lb 3.2 oz (64 kg)  08/05/21 132 lb 6.4 oz (60.1 kg)  07/23/21 130 lb (59 kg)     GEN:  Well nourished, well developed in no acute distress HEENT: Normal NECK: No JVD; right carotid bruit LYMPHATICS: No lymphadenopathy CARDIAC: RRR, no murmurs, rubs, gallops RESPIRATORY:  Clear to auscultation without rales, wheezing or rhonchi  ABDOMEN: Soft, non-tender, non-distended MUSCULOSKELETAL:  No edema; No deformity  SKIN: Warm and dry NEUROLOGIC:  Alert and oriented x 3 PSYCHIATRIC:  Normal affect   ASSESSMENT:    1. CAD in native artery   2. Paroxysmal atrial fibrillation (HCC)   3. Hyperlipidemia, unspecified hyperlipidemia type   4. Stenosis of right carotid artery     PLAN:     CAD: Reported atypical chest pain.  Coronary CTA on 08/19/2021 showed nonobstructive CAD, calcium score 690 (96 percentile).  Echocardiogram 07/23/2021 showed normal biventricular function, no significant valvular disease.   -Continue rosuvastatin 10 mg daily -She is on Eliquis for atrial fibrillation, does not need aspirin  Paroxysmal atrial fibrillation: CHA2DS2-VASc score 2 (age, female).  Echocardiogram 07/23/2021 showed normal biventricular function, no significant valvular disease.  Zio patch x10 days on 08/25/2021 showed 1 episode of NSVT lasting 10 beats, 16 episodes of SVT with longest lasting 19 beats, no atrial fibrillation -Continue Eliquis 5 mg twice daily -Continue metoprolol 25 mg twice daily  Hyperlipidemia: On rosuvastatin 10 mg daily.   LDL 55 on 07/24/2021.  Carotid stenosis: 50 to 69% stenosis in right ICA 07/2018.  Check carotid duplex -Continue Eliquis, statin  Pulmonary nodule: 2 mm left lower lobe lung nodule seen on coronary CTA 08/19/2021.  Given smoking history, follow-up chest CT recommended in 1 year.  RTC in 6 months   Medication Adjustments/Labs and Tests Ordered: Current medicines are reviewed at length with the patient today.  Concerns regarding medicines are outlined above.  Orders Placed This Encounter  Procedures   EKG 12-Lead   VAS US CAROTID   No orders of the defined types were placed in this encounter.   Patient Instructions  Medication Instructions:  Your physician recommends that you continue on your  current medications as directed. Please refer to the Current Medication list given to you today.  *If you need a refill on your cardiac medications before your next appointment, please call your pharmacy*  Testing/Procedures: Your physician has requested that you have a carotid duplex. This test is an ultrasound of the carotid arteries in your neck. It looks at blood flow through these arteries that supply the brain with blood. Allow one hour for this exam. There are no restrictions or special instructions.  Follow-Up: At [facility], you and your health needs are our priority.  As part of our continuing mission to provide you with exceptional heart care, we have created designated Provider Care Teams.  These Care Teams include your primary Cardiologist (physician) and Advanced Practice Providers (APPs -  Physician Assistants and Nurse Practitioners) who all work together to provide you with the care you need, when you need it.  We recommend signing up for the patient portal called "MyChart".  Sign up information is provided on this After Visit Summary.  MyChart is used to connect with patients for Virtual Visits (Telemedicine).  Patients are able to view lab/test results, encounter notes,  upcoming appointments, etc.  Non-urgent messages can be sent to your provider as well.   To learn more about what you can do with MyChart, go to [email or web address].    Your next appointment:   6 month(s)  The format for your next appointment:   In Person  Provider:   Donato Heinz, MD              Signed, Donato Heinz, MD  01/31/2022 11:30 PM    [place]

## 2022-01-31 ENCOUNTER — Encounter: Payer: Self-pay | Admitting: Cardiology

## 2022-01-31 ENCOUNTER — Ambulatory Visit: Payer: Medicare Other | Attending: Cardiology | Admitting: Cardiology

## 2022-01-31 VITALS — BP 136/60 | HR 67 | Ht 61.0 in | Wt 141.2 lb

## 2022-01-31 DIAGNOSIS — I2510 Atherosclerotic heart disease of native coronary artery without angina pectoris: Secondary | ICD-10-CM

## 2022-01-31 DIAGNOSIS — I6521 Occlusion and stenosis of right carotid artery: Secondary | ICD-10-CM

## 2022-01-31 DIAGNOSIS — E785 Hyperlipidemia, unspecified: Secondary | ICD-10-CM | POA: Diagnosis not present

## 2022-01-31 DIAGNOSIS — I480 Paroxysmal atrial fibrillation: Secondary | ICD-10-CM

## 2022-01-31 NOTE — Patient Instructions (Signed)
Medication Instructions:  Your physician recommends that you continue on your current medications as directed. Please refer to the Current Medication list given to you today.  *If you need a refill on your cardiac medications before your next appointment, please call your pharmacy*  Testing/Procedures: Your physician has requested that you have a carotid duplex. This test is an ultrasound of the carotid arteries in your neck. It looks at blood flow through these arteries that supply the brain with blood. Allow one hour for this exam. There are no restrictions or special instructions.  Follow-Up: At [facility], you and your health needs are our priority.  As part of our continuing mission to provide you with exceptional heart care, we have created designated Provider Care Teams.  These Care Teams include your primary Cardiologist (physician) and Advanced Practice Providers (APPs -  Physician Assistants and Nurse Practitioners) who all work together to provide you with the care you need, when you need it.  We recommend signing up for the patient portal called "MyChart".  Sign up information is provided on this After Visit Summary.  MyChart is used to connect with patients for Virtual Visits (Telemedicine).  Patients are able to view lab/test results, encounter notes, upcoming appointments, etc.  Non-urgent messages can be sent to your provider as well.   To learn more about what you can do with MyChart, go to [email or web address].    Your next appointment:   6 month(s)  The format for your next appointment:   In Person  Provider:   Donato Heinz, MD

## 2022-02-10 ENCOUNTER — Ambulatory Visit (HOSPITAL_COMMUNITY)
Admission: RE | Admit: 2022-02-10 | Discharge: 2022-02-10 | Disposition: A | Discharge status: Home / Self Care | Payer: Medicare Other | Source: Ambulatory Visit | Attending: Cardiology | Admitting: Cardiology

## 2022-02-10 DIAGNOSIS — I6521 Occlusion and stenosis of right carotid artery: Secondary | ICD-10-CM | POA: Insufficient documentation

## 2022-02-11 ENCOUNTER — Telehealth: Payer: Self-pay | Admitting: Cardiology

## 2022-02-11 DIAGNOSIS — S00412A Abrasion of left ear, initial encounter: Secondary | ICD-10-CM | POA: Diagnosis not present

## 2022-02-11 DIAGNOSIS — I4891 Unspecified atrial fibrillation: Secondary | ICD-10-CM | POA: Diagnosis not present

## 2022-02-11 NOTE — Telephone Encounter (Signed)
Spoke with pt regarding bleeding in her ear. Pt scratched her ear yesterday and ever since the spot has been bleeding. Pt states that she has done everything she can think of to make it stop. Pt states that she places a cotton ball in her ear and it is saturated within an hour. Advised pt to hold pressure for several minutes to see if bleeding will stop. Advised pt to call her PCP because Dr. Gardiner Rhyme would not be able to advise her on this matter. Pt will call her PCP. Advised pt that if her PCP thought it was necessary for her to be off her Eliquis for a short amount of time we could send that question over to Dr. Gardiner Rhyme. Pt verbalizes understanding.

## 2022-02-11 NOTE — Telephone Encounter (Signed)
Pt c/o medication issue:  1. Name of Medication: Eliquis  2. How are you currently taking this medication (dosage and times per day)? 2 times a day  3. Are you having a reaction (difficulty breathing--STAT)?   4. What is your medication issue? Ear been bleeding since yesterday and it is steady- wants to be seen today if possible please

## 2022-02-14 ENCOUNTER — Encounter: Payer: Self-pay | Admitting: *Deleted

## 2022-03-02 DIAGNOSIS — Z23 Encounter for immunization: Secondary | ICD-10-CM | POA: Diagnosis not present

## 2022-07-26 ENCOUNTER — Ambulatory Visit (HOSPITAL_COMMUNITY)
Admission: RE | Admit: 2022-07-26 | Discharge: 2022-07-26 | Disposition: A | Discharge status: Home / Self Care | Payer: Medicare Other | Source: Ambulatory Visit | Attending: Cardiology | Admitting: Cardiology

## 2022-07-26 DIAGNOSIS — R918 Other nonspecific abnormal finding of lung field: Secondary | ICD-10-CM | POA: Diagnosis not present

## 2022-07-26 DIAGNOSIS — R911 Solitary pulmonary nodule: Secondary | ICD-10-CM

## 2022-07-26 DIAGNOSIS — J439 Emphysema, unspecified: Secondary | ICD-10-CM | POA: Diagnosis not present

## 2022-07-31 NOTE — Progress Notes (Unsigned)
Cardiology Office Note:    Date:  08/03/2022   ID:  Evelyn [name], DOB 11-14-1950, MRN [id number]  PCP:  Antony Contras, MD  Cardiologist:  Donato Heinz, MD  Electrophysiologist:  None   Referring MD: Antony Contras, MD   Chief Complaint  Patient presents with   Atrial Fibrillation    History of Present Illness:    Evelyn [name] is a 72 y.o. female with a hx of hyperlipidemia, paroxysmal atrial fibrillation who presents for follow-up.  She was referred by Dr. Alyson Ingles for evaluation of atrial fibrillation, initially seen 08/05/2021.  She was admitted from 3/3 through 07/24/2021 with chest pain.  Troponins negative.  She was found to have paroxysmal atrial fibrillation while admitted.  Echocardiogram 07/23/2021 showed normal biventricular function, no significant valvular disease.  Started on metoprolol and Eliquis.  Coronary CTA on 08/19/2021 showed nonobstructive CAD, calcium score 690 (96 percentile).  Zio patch x10 days on 08/25/2021 showed 1 episode of NSVT lasting 10 beats, 16 episodes of SVT with longest lasting 19 beats.  Since last clinic visit, she reports she has been having significant shortness of breath.  States that she used to do the elliptical at the gym but is getting too short of breath has not been able to use.  She currently walks on treadmill for about 20 minutes.  Reports shortness of breath and occasional chest tightness.  She denies any bleeding on Eliquis.  No palpitations.  Past Medical History:  Diagnosis Date   Allergy    seasonal   Cataract    bilateral removed   Hyperlipidemia    Osteopenia    Osteoporosis     Past Surgical History:  Procedure Laterality Date   ABDOMINAL HYSTERECTOMY     CARPAL TUNNEL RELEASE Bilateral    CATARACT EXTRACTION Bilateral    CESAREAN SECTION     x2   COLONOSCOPY     UTERINE FIBROID SURGERY  1999    Current Medications: Current Meds  Medication Sig   Calcium Carb-Cholecalciferol (CALCIUM 600+D3 PO) Take  1 tablet by mouth daily with breakfast.   famotidine (PEPCID) 40 MG tablet 1 tablet   fexofenadine (ALLEGRA) 180 MG tablet Take 180 mg by mouth daily as needed (for seasonal allergies).   omeprazole (PRILOSEC) 40 MG capsule Take 40 mg by mouth every morning.   raloxifene (EVISTA) 60 MG tablet Take 60 mg by mouth in the morning.   rosuvastatin (CRESTOR) 10 MG tablet Take 10 mg by mouth in the morning.   [DISCONTINUED] apixaban (ELIQUIS) 5 MG TABS tablet Take 1 tablet (5 mg total) by mouth 2 (two) times daily.   [DISCONTINUED] metoprolol tartrate (LOPRESSOR) 25 MG tablet Take 1 tablet (25 mg total) by mouth 2 (two) times daily.     Allergies:   Ibuprofen   Social History   Socioeconomic History   Marital status: Married    Spouse name: Not on file   Number of children: Not on file   Years of education: Not on file   Highest education level: Not on file  Occupational History   Not on file  Tobacco Use   Smoking status: Former   Smokeless tobacco: Never  Vaping Use   Vaping Use: Never used  Substance and Sexual Activity   Alcohol use: Not Currently    Comment: occa   Drug use: No   Sexual activity: Not on file  Other Topics Concern   Not on file  Social History Narrative  Not on file   Social Determinants of Health   Financial Resource Strain: Not on file  Food Insecurity: Not on file  Transportation Needs: Not on file  Physical Activity: Not on file  Stress: Not on file  Social Connections: Not on file     Family History: The patient's family history is negative for Colon cancer, Colon polyps, Esophageal cancer, Rectal cancer, and Stomach cancer.  ROS:   Please see the history of present illness.     All other systems reviewed and are negative.  EKGs/Labs/Other Studies Reviewed:    The following studies were reviewed today:   EKG:   08/01/22: Normal sinus rhythm, rate 71, no ST abnormalities 01/31/22: NSR, rate 67, no ST abnormalities 07/26/21: Normal sinus  rhythm, rate 68, no ST abnormalities  Recent Labs: 08/05/2021: TSH 0.758 08/01/2022: ALT 14; BUN 12; Creatinine, Ser 0.82; Hemoglobin 12.9; Platelets 242; Potassium 4.5; Sodium 141  Recent Lipid Panel    Component Value Date/Time   CHOL 192 08/01/2022 1613   TRIG 154 (H) 08/01/2022 1613   HDL 72 08/01/2022 1613   CHOLHDL 2.7 08/01/2022 1613   CHOLHDL 2.0 07/24/2021 0427   VLDL 20 07/24/2021 0427   LDLCALC 94 08/01/2022 1613    Physical Exam:    VS:  BP 132/66 (BP Location: Right Arm, Patient Position: Sitting, Cuff Size: Normal)   Pulse 71   Ht '5\' 1"'$  (1.549 m)   Wt 143 lb 9.6 oz (65.1 kg)   SpO2 95%   BMI 27.13 kg/m     Wt Readings from Last 3 Encounters:  08/01/22 143 lb 9.6 oz (65.1 kg)  01/31/22 141 lb 3.2 oz (64 kg)  08/05/21 132 lb 6.4 oz (60.1 kg)     GEN:  Well nourished, well developed in no acute distress HEENT: Normal NECK: No JVD; right carotid bruit LYMPHATICS: No lymphadenopathy CARDIAC: RRR, no murmurs, rubs, gallops RESPIRATORY:  Clear to auscultation without rales, wheezing or rhonchi  ABDOMEN: Soft, non-tender, non-distended MUSCULOSKELETAL:  No edema; No deformity  SKIN: Warm and dry NEUROLOGIC:  Alert and oriented x 3 PSYCHIATRIC:  Normal affect   ASSESSMENT:    1. CAD in native artery   2. Paroxysmal atrial fibrillation (HCC)   3. Shortness of breath   4. Chronic obstructive pulmonary disease, unspecified COPD type ([place])   5. Hyperlipidemia, unspecified hyperlipidemia type      PLAN:    CAD: Reported atypical chest pain.  Coronary CTA on 08/19/2021 showed nonobstructive CAD, calcium score 690 (96 percentile).  Echocardiogram 07/23/2021 showed normal biventricular function, no significant valvular disease.   -Continue rosuvastatin 10 mg daily -She is on Eliquis for atrial fibrillation, does not need aspirin -She is reporting worsening dyspnea on exertion.  Recommend exercise Myoview for further evaluation.  If unremarkable, suspect symptoms  are due to COPD, will refer to pulmonology  Paroxysmal atrial fibrillation: CHA2DS2-VASc score 2 (age, female).  Echocardiogram 07/23/2021 showed normal biventricular function, no significant valvular disease.  Zio patch x10 days on 08/25/2021 showed 1 episode of NSVT lasting 10 beats, 16 episodes of SVT with longest lasting 19 beats, no atrial fibrillation -Continue Eliquis 5 mg twice daily -Continue metoprolol 25 mg twice daily  Hyperlipidemia: On rosuvastatin 10 mg daily.  LDL 55 on 07/24/2021.  Check lipid panel  Carotid stenosis: 50 to 69% stenosis in right ICA 07/2018.  Carotid duplex 01/2022 showed 40 to 59% right carotid stenosis, 1 to 39% left carotid stenosis. -Continue Eliquis, statin  Pulmonary nodule: 2  mm left lower lobe lung nodule seen on coronary CTA 08/19/2021.  Repeat CT chest 07/2022 showed unchanged pulmonary nodules, no follow-up needed.  However also noted to have moderate emphysema and given degree of emphysema, annual lung cancer screening CTs could be considered.  Recommend refferal to pulmonology  RTC in 6 months  Shared Decision Making/Informed Consent The risks [chest pain, shortness of breath, cardiac arrhythmias, dizziness, blood pressure fluctuations, myocardial infarction, stroke/transient ischemic attack, nausea, vomiting, allergic reaction, radiation exposure, metallic taste sensation and life-threatening complications (estimated to be 1 in 10,000)], benefits (risk stratification, diagnosing coronary artery disease, treatment guidance) and alternatives of a nuclear stress test were discussed in detail with Ms. Okelly and she agrees to proceed.    Medication Adjustments/Labs and Tests Ordered: Current medicines are reviewed at length with the patient today.  Concerns regarding medicines are outlined above.  Orders Placed This Encounter  Procedures   Lipid panel   Comprehensive metabolic panel   CBC   Ambulatory referral to Pulmonology   MYOCARDIAL PERFUSION IMAGING    EKG 12-Lead   Meds ordered this encounter  Medications   apixaban (ELIQUIS) 5 MG TABS tablet    Sig: Take 1 tablet (5 mg total) by mouth 2 (two) times daily.    Dispense:  180 tablet    Refill:  1   metoprolol tartrate (LOPRESSOR) 25 MG tablet    Sig: Take 1 tablet (25 mg total) by mouth 2 (two) times daily.    Dispense:  180 tablet    Refill:  3    Patient Instructions  Medication Instructions:  Your physician recommends that you continue on your current medications as directed. Please refer to the Current Medication list given to you today.  *If you need a refill on your cardiac medications before your next appointment, please call your pharmacy*   Lab Work: CMET, CBC, Lipid today  If you have labs (blood work) drawn today and your tests are completely normal, you will receive your results only by: [place] (if you have MyChart) OR A paper copy in the mail If you have any lab test that is abnormal or we need to change your treatment, we will call you to review the results.   Testing/Procedures: Your physician has requested that you have an exercise stress myoview. For further information please visit [email or web address]. Please follow instruction sheet, as given.  Follow-Up: At [facility], you and your health needs are our priority.  As part of our continuing mission to provide you with exceptional heart care, we have created designated Provider Care Teams.  These Care Teams include your primary Cardiologist (physician) and Advanced Practice Providers (APPs -  Physician Assistants and Nurse Practitioners) who all work together to provide you with the care you need, when you need it.  We recommend signing up for the patient portal called "MyChart".  Sign up information is provided on this After Visit Summary.  MyChart is used to connect with patients for Virtual Visits (Telemedicine).  Patients are able to view lab/test results, encounter notes, upcoming  appointments, etc.  Non-urgent messages can be sent to your provider as well.   To learn more about what you can do with MyChart, go to [email or web address].    Your next appointment:   6 month(s)  Provider:   Donato Heinz, MD     Other Instructions You have been referred to: Pulmonology     Signed, Donato Heinz, MD  08/03/2022 2:20 PM  [facility]

## 2022-08-01 ENCOUNTER — Encounter: Payer: Self-pay | Admitting: Cardiology

## 2022-08-01 ENCOUNTER — Ambulatory Visit: Payer: Medicare Other | Attending: Cardiology | Admitting: Cardiology

## 2022-08-01 VITALS — BP 132/66 | HR 71 | Ht 61.0 in | Wt 143.6 lb

## 2022-08-01 DIAGNOSIS — R0602 Shortness of breath: Secondary | ICD-10-CM | POA: Diagnosis not present

## 2022-08-01 DIAGNOSIS — I2510 Atherosclerotic heart disease of native coronary artery without angina pectoris: Secondary | ICD-10-CM

## 2022-08-01 DIAGNOSIS — J449 Chronic obstructive pulmonary disease, unspecified: Secondary | ICD-10-CM

## 2022-08-01 DIAGNOSIS — I480 Paroxysmal atrial fibrillation: Secondary | ICD-10-CM | POA: Diagnosis not present

## 2022-08-01 DIAGNOSIS — E785 Hyperlipidemia, unspecified: Secondary | ICD-10-CM

## 2022-08-01 MED ORDER — APIXABAN 5 MG PO TABS: 5.0000 mg | ORAL_TABLET | Freq: Two times a day (BID) | ORAL | 1 refills | Status: DC

## 2022-08-01 MED ORDER — METOPROLOL TARTRATE 25 MG PO TABS: 25.0000 mg | ORAL_TABLET | Freq: Two times a day (BID) | ORAL | 3 refills | Status: DC

## 2022-08-01 NOTE — Patient Instructions (Signed)
Medication Instructions:  Your physician recommends that you continue on your current medications as directed. Please refer to the Current Medication list given to you today.  *If you need a refill on your cardiac medications before your next appointment, please call your pharmacy*   Lab Work: CMET, CBC, Lipid today  If you have labs (blood work) drawn today and your tests are completely normal, you will receive your results only by: [place] (if you have MyChart) OR A paper copy in the mail If you have any lab test that is abnormal or we need to change your treatment, we will call you to review the results.   Testing/Procedures: Your physician has requested that you have an exercise stress myoview. For further information please visit [email or web address]. Please follow instruction sheet, as given.  Follow-Up: At [facility], you and your health needs are our priority.  As part of our continuing mission to provide you with exceptional heart care, we have created designated Provider Care Teams.  These Care Teams include your primary Cardiologist (physician) and Advanced Practice Providers (APPs -  Physician Assistants and Nurse Practitioners) who all work together to provide you with the care you need, when you need it.  We recommend signing up for the patient portal called "MyChart".  Sign up information is provided on this After Visit Summary.  MyChart is used to connect with patients for Virtual Visits (Telemedicine).  Patients are able to view lab/test results, encounter notes, upcoming appointments, etc.  Non-urgent messages can be sent to your provider as well.   To learn more about what you can do with MyChart, go to [email or web address].    Your next appointment:   6 month(s)  Provider:   Donato Heinz, MD     Other Instructions You have been referred to: Pulmonology

## 2022-08-02 ENCOUNTER — Telehealth (HOSPITAL_COMMUNITY): Payer: Self-pay | Admitting: *Deleted

## 2022-08-02 ENCOUNTER — Telehealth: Payer: Self-pay | Admitting: Cardiology

## 2022-08-02 LAB — COMPREHENSIVE METABOLIC PANEL
ALT: 14 IU/L (ref 0–32)
AST: 24 IU/L (ref 0–40)
Albumin/Globulin Ratio: 1.8 (ref 1.2–2.2)
Albumin: 4.6 g/dL (ref 3.9–4.9)
Alkaline Phosphatase: 71 IU/L (ref 44–121)
BUN/Creatinine Ratio: 15 (ref 12–28)
BUN: 12 mg/dL (ref 8–27)
Bilirubin Total: <0.2 mg/dL (ref 0.0–1.2)
CO2: 24 mmol/L (ref 20–29)
Calcium: 9.4 mg/dL (ref 8.7–10.3)
Chloride: 101 mmol/L (ref 96–106)
Creatinine, Ser: 0.82 mg/dL (ref 0.57–1.00)
Globulin, Total: 2.5 g/dL (ref 1.5–4.5)
Glucose: 81 mg/dL (ref 70–99)
Potassium: 4.5 mmol/L (ref 3.5–5.2)
Sodium: 141 mmol/L (ref 134–144)
Total Protein: 7.1 g/dL (ref 6.0–8.5)
eGFR: 77 mL/min/{1.73_m2} (ref >59)

## 2022-08-02 LAB — CBC
Hematocrit: 39.2 % (ref 34.0–46.6)
Hemoglobin: 12.9 g/dL (ref 11.1–15.9)
MCH: 30.6 pg (ref 26.6–33.0)
MCHC: 32.9 g/dL (ref 31.5–35.7)
MCV: 93 fL (ref 79–97)
Platelets: 242 10*3/uL (ref 150–450)
RBC: 4.21 x10E6/uL (ref 3.77–5.28)
RDW: 12.3 % (ref 11.7–15.4)
WBC: 5.5 10*3/uL (ref 3.4–10.8)

## 2022-08-02 LAB — LIPID PANEL
Chol/HDL Ratio: 2.7 ratio (ref 0.0–4.4)
Cholesterol, Total: 192 mg/dL (ref 100–199)
HDL: 72 mg/dL (ref >39)
LDL Chol Calc (NIH): 94 mg/dL (ref 0–99)
Triglycerides: 154 mg/dL — ABNORMAL HIGH (ref 0–149)
VLDL Cholesterol Cal: 26 mg/dL (ref 5–40)

## 2022-08-02 NOTE — Telephone Encounter (Signed)
Pt reached and given instructions for MPI study scheduled on 08/04/22. 

## 2022-08-02 NOTE — Telephone Encounter (Signed)
Called pt, went over her lab results. She states she takes Rosuvastatin every morning.

## 2022-08-02 NOTE — Telephone Encounter (Signed)
Pt is calling Evelyn [name] back in regard to labs results. Please advise.

## 2022-08-03 NOTE — Telephone Encounter (Signed)
Recommend increasing rosuvastatin to 20 mg daily

## 2022-08-04 ENCOUNTER — Ambulatory Visit (HOSPITAL_COMMUNITY): Payer: Medicare Other | Attending: Cardiovascular Disease

## 2022-08-04 DIAGNOSIS — R0602 Shortness of breath: Secondary | ICD-10-CM | POA: Diagnosis not present

## 2022-08-04 LAB — MYOCARDIAL PERFUSION IMAGING
Angina Index: 0
Duke Treadmill Score: 5
Estimated workload: 5.3
Exercise duration (min): 4 min
Exercise duration (sec): 31 s
LV dias vol: 37 mL (ref 46–106)
LV sys vol: 10 mL
MPHR: 151 {beats}/min
Nuc Stress EF: 73 %
Peak HR: 139 {beats}/min
Percent HR: 92 %
Rest HR: 92 {beats}/min
Rest Nuclear Isotope Dose: 10.9 mCi
SDS: 4
SRS: 0
SSS: 4
ST Depression (mm): 0 mm
Stress Nuclear Isotope Dose: 32 mCi
TID: 1

## 2022-08-04 MED ORDER — TECHNETIUM TC 99M TETROFOSMIN IV KIT
10.9000 | PACK | Freq: Once | INTRAVENOUS | Status: AC | PRN
  Administered 2022-08-04: 10.9 via INTRAVENOUS

## 2022-08-04 MED ORDER — TECHNETIUM TC 99M TETROFOSMIN IV KIT
32.0000 | PACK | Freq: Once | INTRAVENOUS | Status: AC | PRN
  Administered 2022-08-04: 32 via INTRAVENOUS

## 2022-08-05 ENCOUNTER — Telehealth: Payer: Self-pay | Admitting: Cardiology

## 2022-08-05 NOTE — Telephone Encounter (Signed)
   Donato Heinz, MD 08/05/2022 10:19 AM EDT     Normal stress test    Patient aware of results and verbalized understanding.   Patient states she had to hold her metoprolol for the stress test and her breathing felt 100% better, back to baseline.  She has restarted the metoprolol but is only taking it 1x a day.   She also would like to hold off on increasing cholesterol medication (see other phone message) and hold off on pulmonologist referral at this time.   She request appointment to discuss with Dr. Gardiner Rhyme.    Appt scheduled.

## 2022-08-05 NOTE — Telephone Encounter (Signed)
Patient is returning call to discuss stress test results. 

## 2022-08-05 NOTE — Telephone Encounter (Signed)
See other phone note

## 2022-09-01 NOTE — Progress Notes (Signed)
Cardiology Office Note:    Date:  09/02/2022   ID:  Evelyn [name], DOB [birth year], MRN [id number]  PCP:  Tally Joe, MD  Cardiologist:  Little Ishikawa, MD  Electrophysiologist:  None   Referring MD: Tally Joe, MD   Chief Complaint  Patient presents with   Atrial Fibrillation    History of Present Illness:    Evelyn [name] is a 72 y.o. female with a hx of hyperlipidemia, paroxysmal atrial fibrillation who presents for follow-up.  She was referred by Dr. Nicholos Johns for evaluation of atrial fibrillation, initially seen 08/05/2021.  She was admitted from 3/3 through 07/24/2021 with chest pain.  Troponins negative.  She was found to have paroxysmal atrial fibrillation while admitted.  Echocardiogram 07/23/2021 showed normal biventricular function, no significant valvular disease.  Started on metoprolol and Eliquis.  Coronary CTA on 08/19/2021 showed nonobstructive CAD, calcium score 690 (96 percentile).  Zio patch x10 days on 08/25/2021 showed 1 episode of NSVT lasting 10 beats, 16 episodes of SVT with longest lasting 19 beats.  She reported worsening dyspnea and underwent exercise Myoview 07/2022, which showed poor exercise capacity (5.3 METS), normal perfusion, EF 73%.  Since last clinic visit, she reports she is doing okay.  Reports shortness of breath has improved.  She stopped metoprolol and symptoms have significantly improved.  Denies any bleeding issues on Eliquis.  Past Medical History:  Diagnosis Date   Allergy    seasonal   Cataract    bilateral removed   Hyperlipidemia    Osteopenia    Osteoporosis     Past Surgical History:  Procedure Laterality Date   ABDOMINAL HYSTERECTOMY     CARPAL TUNNEL RELEASE Bilateral    CATARACT EXTRACTION Bilateral    CESAREAN SECTION     x2   COLONOSCOPY     UTERINE FIBROID SURGERY  1999    Current Medications: Current Meds  Medication Sig   apixaban (ELIQUIS) 5 MG TABS tablet Take 1 tablet (5 mg total) by mouth 2 (two)  times daily.   Calcium Carb-Cholecalciferol (CALCIUM 600+D3 PO) Take 1 tablet by mouth daily with breakfast.   famotidine (PEPCID) 40 MG tablet 1 tablet   fexofenadine (ALLEGRA) 180 MG tablet Take 180 mg by mouth daily as needed (for seasonal allergies).   omeprazole (PRILOSEC) 40 MG capsule Take 40 mg by mouth every morning.   raloxifene (EVISTA) 60 MG tablet Take 60 mg by mouth in the morning.   rosuvastatin (CRESTOR) 10 MG tablet Take 10 mg by mouth in the morning.   [DISCONTINUED] metoprolol tartrate (LOPRESSOR) 25 MG tablet Take 1 tablet (25 mg total) by mouth 2 (two) times daily. (Patient taking differently: Take 25 mg by mouth daily.)     Allergies:   Ibuprofen   Social History   Socioeconomic History   Marital status: Married    Spouse name: Not on file   Number of children: Not on file   Years of education: Not on file   Highest education level: Not on file  Occupational History   Not on file  Tobacco Use   Smoking status: Former   Smokeless tobacco: Never  Vaping Use   Vaping Use: Never used  Substance and Sexual Activity   Alcohol use: Not Currently    Comment: occa   Drug use: No   Sexual activity: Not on file  Other Topics Concern   Not on file  Social History Narrative   Not on file   Social  Determinants of Health   Financial Resource Strain: Not on file  Food Insecurity: Not on file  Transportation Needs: Not on file  Physical Activity: Not on file  Stress: Not on file  Social Connections: Not on file     Family History: The patient's family history is negative for Colon cancer, Colon polyps, Esophageal cancer, Rectal cancer, and Stomach cancer.  ROS:   Please see the history of present illness.     All other systems reviewed and are negative.  EKGs/Labs/Other Studies Reviewed:    The following studies were reviewed today:   EKG:   08/01/22: Normal sinus rhythm, rate 71, no ST abnormalities 01/31/22: NSR, rate 67, no ST abnormalities 07/26/21:  Normal sinus rhythm, rate 68, no ST abnormalities  Recent Labs: 08/01/2022: ALT 14; BUN 12; Creatinine, Ser 0.82; Hemoglobin 12.9; Platelets 242; Potassium 4.5; Sodium 141  Recent Lipid Panel    Component Value Date/Time   CHOL 192 08/01/2022 1613   TRIG 154 (H) 08/01/2022 1613   HDL 72 08/01/2022 1613   CHOLHDL 2.7 08/01/2022 1613   CHOLHDL 2.0 07/24/2021 0427   VLDL 20 07/24/2021 0427   LDLCALC 94 08/01/2022 1613    Physical Exam:    VS:  BP 122/62   Pulse 95   Ht 5\' 1"  (1.549 m)   Wt 142 lb 12.8 oz (64.8 kg)   SpO2 95%   BMI 26.98 kg/m     Wt Readings from Last 3 Encounters:  09/02/22 142 lb 12.8 oz (64.8 kg)  08/04/22 143 lb (64.9 kg)  08/01/22 143 lb 9.6 oz (65.1 kg)     GEN:  Well nourished, well developed in no acute distress HEENT: Normal NECK: No JVD; right carotid bruit LYMPHATICS: No lymphadenopathy CARDIAC: RRR, no murmurs, rubs, gallops RESPIRATORY:  Clear to auscultation without rales, wheezing or rhonchi  ABDOMEN: Soft, non-tender, non-distended MUSCULOSKELETAL:  No edema; No deformity  SKIN: Warm and dry NEUROLOGIC:  Alert and oriented x 3 PSYCHIATRIC:  Normal affect   ASSESSMENT:    1. CAD in native artery   2. Paroxysmal atrial fibrillation   3. Chronic obstructive pulmonary disease, unspecified COPD type     PLAN:    CAD: Reported atypical chest pain.  Coronary CTA on 08/19/2021 showed nonobstructive CAD, calcium score 690 (96 percentile).  Echocardiogram 07/23/2021 showed normal biventricular function, no significant valvular disease.   She reported worsening dyspnea and underwent exercise Myoview 07/2022, which showed poor exercise capacity (5.3 METS), normal perfusion, EF 73%.   -Continue rosuvastatin 10 mg daily -She is on Eliquis for atrial fibrillation, does not need aspirin -Suspect dyspnea is from COPD, referred to pulmonology  Paroxysmal atrial fibrillation: CHA2DS2-VASc score 2 (age, female).  Echocardiogram 07/23/2021 showed normal  biventricular function, no significant valvular disease.  Zio patch x10 days on 08/25/2021 showed 1 episode of NSVT lasting 10 beats, 16 episodes of SVT with longest lasting 19 beats, no atrial fibrillation -Continue Eliquis 5 mg twice daily -Did not tolerate metoprolol, reported feeling fatigued and short of breath which improved with stopping metoprolol.  Will hold off on AV nodal blockers for now.  Recommend a [employer] mobile device to monitor for recurrent A-fib  Hyperlipidemia: On rosuvastatin 10 mg daily, LDL 94 on 08/01/2022.  Rosuvastatin increased to 20 mg daily  Carotid stenosis: 50 to 69% stenosis in right ICA 07/2018.  Carotid duplex 01/2022 showed 40 to 59% right carotid stenosis, 1 to 39% left carotid stenosis. -Continue Eliquis, statin  Pulmonary nodule: 2 mm  left lower lobe lung nodule seen on coronary CTA 08/19/2021.  Repeat CT chest 07/2022 showed unchanged pulmonary nodules, no follow-up needed.  However also noted to have moderate emphysema and given degree of emphysema, annual lung cancer screening CTs could be considered.  Recommend referral to pulmonology  RTC in 6 months   Medication Adjustments/Labs and Tests Ordered: Current medicines are reviewed at length with the patient today.  Concerns regarding medicines are outlined above.  Orders Placed This Encounter  Procedures   Ambulatory referral to Pulmonology   No orders of the defined types were placed in this encounter.   Patient Instructions  Medication Instructions:  STOP metoprolol  *If you need a refill on your cardiac medications before your next appointment, please call your pharmacy*  Follow-Up: At [facility], you and your health needs are our priority.  As part of our continuing mission to provide you with exceptional heart care, we have created designated Provider Care Teams.  These Care Teams include your primary Cardiologist (physician) and Advanced Practice Providers (APPs -  Physician Assistants  and Nurse Practitioners) who all work together to provide you with the care you need, when you need it.  We recommend signing up for the patient portal called "MyChart".  Sign up information is provided on this After Visit Summary.  MyChart is used to connect with patients for Virtual Visits (Telemedicine).  Patients are able to view lab/test results, encounter notes, upcoming appointments, etc.  Non-urgent messages can be sent to your provider as well.   To learn more about what you can do with MyChart, go to [email or web address].    Your next appointment:   As scheduled   Other Instructions You have been referred to: Pulmonology   You can look into the [facility] Mobile device by [employer]. This device is purchased by you and it connects to an application you download to your smart phone.  It can detect abnormal heart rhythms and alert you to contact your doctor for further evaluation. The web site is:  [email or web address]       Signed, Little Ishikawahristopher L Camile Esters, MD  09/02/2022 5:44 PM     [facility]

## 2022-09-02 ENCOUNTER — Ambulatory Visit: Payer: Medicare Other | Attending: Cardiology | Admitting: Cardiology

## 2022-09-02 ENCOUNTER — Encounter: Payer: Self-pay | Admitting: Cardiology

## 2022-09-02 VITALS — BP 122/62 | HR 95 | Ht 61.0 in | Wt 142.8 lb

## 2022-09-02 DIAGNOSIS — I2510 Atherosclerotic heart disease of native coronary artery without angina pectoris: Secondary | ICD-10-CM | POA: Diagnosis not present

## 2022-09-02 DIAGNOSIS — I480 Paroxysmal atrial fibrillation: Secondary | ICD-10-CM | POA: Diagnosis not present

## 2022-09-02 DIAGNOSIS — J449 Chronic obstructive pulmonary disease, unspecified: Secondary | ICD-10-CM | POA: Diagnosis not present

## 2022-09-02 NOTE — Patient Instructions (Signed)
Medication Instructions:  STOP metoprolol  *If you need a refill on your cardiac medications before your next appointment, please call your pharmacy*  Follow-Up: At [facility], you and your health needs are our priority.  As part of our continuing mission to provide you with exceptional heart care, we have created designated Provider Care Teams.  These Care Teams include your primary Cardiologist (physician) and Advanced Practice Providers (APPs -  Physician Assistants and Nurse Practitioners) who all work together to provide you with the care you need, when you need it.  We recommend signing up for the patient portal called "MyChart".  Sign up information is provided on this After Visit Summary.  MyChart is used to connect with patients for Virtual Visits (Telemedicine).  Patients are able to view lab/test results, encounter notes, upcoming appointments, etc.  Non-urgent messages can be sent to your provider as well.   To learn more about what you can do with MyChart, go to [email or web address].    Your next appointment:   As scheduled   Other Instructions You have been referred to: Pulmonology   You can look into the [facility] device by [employer]. This device is purchased by you and it connects to an application you download to your smart phone.  It can detect abnormal heart rhythms and alert you to contact your doctor for further evaluation. The web site is:  [email or web address].alivecor.[email or web address]

## 2022-09-22 ENCOUNTER — Ambulatory Visit: Payer: Medicare Other | Admitting: Pulmonary Disease

## 2022-09-22 ENCOUNTER — Encounter: Payer: Self-pay | Admitting: Pulmonary Disease

## 2022-09-22 VITALS — BP 126/72 | HR 83 | Ht 61.0 in | Wt 138.6 lb

## 2022-09-22 DIAGNOSIS — J439 Emphysema, unspecified: Secondary | ICD-10-CM

## 2022-09-22 DIAGNOSIS — R0609 Other forms of dyspnea: Secondary | ICD-10-CM

## 2022-09-22 MED ORDER — STIOLTO RESPIMAT 2.5-2.5 MCG/ACT IN AERS: 2.0000 | INHALATION_SPRAY | Freq: Every day | RESPIRATORY_TRACT | 3 refills | Status: DC

## 2022-09-22 NOTE — Patient Instructions (Addendum)
It is nice to meet you  I think you have good reasons to be short of breath  Since the metoprolol make things worse and things are worse after your COVID infection, it sounds like developed something like asthma after the viral infection.  We do see this fairly commonly.  In addition, there is emphysema on your CT scan.  This can make people feel short of breath.  To treat both, use Stiolto 2 puffs in the morning, every day.  Please send me a message or call and leave a message if the medication is too expensive and I will look for an alternative.  In addition, if you have any side effects after starting the medication please stop it and call me and let me know.  I ordered pulmonary function test to further evaluate your breathing.  These can be done after your trip in June.  Please see if you can schedule these at the front desk before you leave.  Return to clinic in 3 months or sooner as needed with Dr. Judeth Horn

## 2022-09-22 NOTE — Progress Notes (Signed)
@Patient  ID: Evelyn [name], female    DOB: December 02, 1950, 72 y.o.   MRN: [id number]  Chief Complaint  Patient presents with   Consult    Referred by cardiologist for possible COPD. States she has been more SOB since March 2023, especially with exertion.     Referring provider: Little Ishikawa*  HPI:   72 y.o. woman whom we are seeing for evaluation of dyspnea on exertion.  Most recent cardiology note x 2 reviewed.  She notes getting COVID infection.  She cannot tell me exactly when but over the last couple of years.  Since then she has had shortness of breath.  Chest tightness occasionally.  More short of breath when walking on flat surfaces.  She exercises regularly and finds it sometimes as little more challenging.  Walks on the treadmill at different speeds and different inclines.  Continue to do this 3 times a day.  She has undergone CT scans for evaluation.  The first CT scans 07/2021 that revealed significant emphysema as well as small 5 mm nodule.  Subsequent repeat CT scan in 07/2022 revealed stable nodules, again severe emphysematous changes on my review and interpretation.  No time of day when things are better or worse.  No position make things better or worse.  No seasonal or environmental factors she can identify to make things better or worse.  She said that when she would take metoprolol her chest will feel tight.  More symptomatic.  She feels better since stopping the metoprolol.  We discussed given beta-blockade causing chest tightness and worsening symptoms as well as symptoms starting after viral infection, likely postviral activation of asthma.  We discussed role and rationale for pulmonary function test to help further define any issues with breathing as well as to better care for her in the future.    Questionaires / Pulmonary Flowsheets:   ACT:      No data to display          MMRC:     No data to display          Epworth:      No data to  display          Tests:   FENO:  No results found for: "NITRICOXIDE"  PFT:     No data to display          WALK:      No data to display          Imaging: Personally reviewed and as per EMR and discussion in this note No results found.  Lab Results: Personally reviewed CBC    Component Value Date/Time   WBC 5.5 08/01/2022 1613   WBC 9.4 07/23/2021 0620   RBC 4.21 08/01/2022 1613   RBC 4.20 07/23/2021 0620   HGB 12.9 08/01/2022 1613   HCT 39.2 08/01/2022 1613   PLT 242 08/01/2022 1613   MCV 93 08/01/2022 1613   MCH 30.6 08/01/2022 1613   MCH 31.4 07/23/2021 0620   MCHC 32.9 08/01/2022 1613   MCHC 33.2 07/23/2021 0620   RDW 12.3 08/01/2022 1613   LYMPHSABS 1.5 07/23/2021 0620   MONOABS 0.5 07/23/2021 0620   EOSABS 0.1 07/23/2021 0620   BASOSABS 0.0 07/23/2021 0620    BMET    Component Value Date/Time   NA 141 08/01/2022 1613   K 4.5 08/01/2022 1613   CL 101 08/01/2022 1613   CO2 24 08/01/2022 1613   GLUCOSE 81 08/01/2022 1613  GLUCOSE 89 07/23/2021 0620   BUN 12 08/01/2022 1613   CREATININE 0.82 08/01/2022 1613   CALCIUM 9.4 08/01/2022 1613   GFRNONAA >60 07/23/2021 0620    BNP No results found for: "BNP"  ProBNP No results found for: "PROBNP"  Specialty Problems   None   Allergies  Allergen Reactions   Ibuprofen Nausea Only    Immunization History  Administered Date(s) Administered   PFIZER(Purple Top)SARS-COV-2 Vaccination 04/04/2020    Past Medical History:  Diagnosis Date   Allergy    seasonal   Cataract    bilateral removed   Hyperlipidemia    Osteopenia    Osteoporosis     Tobacco History: Social History   Tobacco Use  Smoking Status Former  Smokeless Tobacco Never   Counseling given: Not Answered   Continue to not smoke  Outpatient Encounter Medications as of 09/22/2022  Medication Sig   apixaban (ELIQUIS) 5 MG TABS tablet Take 1 tablet (5 mg total) by mouth 2 (two) times daily.   Calcium  Carb-Cholecalciferol (CALCIUM 600+D3 PO) Take 1 tablet by mouth daily with breakfast.   famotidine (PEPCID) 40 MG tablet 1 tablet   fexofenadine (ALLEGRA) 180 MG tablet Take 180 mg by mouth daily as needed (for seasonal allergies).   omeprazole (PRILOSEC) 40 MG capsule Take 40 mg by mouth every morning.   raloxifene (EVISTA) 60 MG tablet Take 60 mg by mouth in the morning.   rosuvastatin (CRESTOR) 10 MG tablet Take 10 mg by mouth in the morning.   Tiotropium Bromide-Olodaterol (STIOLTO RESPIMAT) 2.5-2.5 MCG/ACT AERS Inhale 2 puffs into the lungs daily.   No facility-administered encounter medications on file as of 09/22/2022.     Review of Systems  Review of Systems  No chest pain with exertion.  No orthopnea or PND.  Comprehensive review of systems otherwise negative. Physical Exam  BP 126/72   Pulse 83   Ht 5\' 1"  (1.549 m)   Wt 138 lb 9.6 oz (62.9 kg)   SpO2 97% Comment: on RA  BMI 26.19 kg/m   Wt Readings from Last 5 Encounters:  09/22/22 138 lb 9.6 oz (62.9 kg)  09/02/22 142 lb 12.8 oz (64.8 kg)  08/04/22 143 lb (64.9 kg)  08/01/22 143 lb 9.6 oz (65.1 kg)  01/31/22 141 lb 3.2 oz (64 kg)    BMI Readings from Last 5 Encounters:  09/22/22 26.19 kg/m  09/02/22 26.98 kg/m  08/04/22 27.02 kg/m  08/01/22 27.13 kg/m  01/31/22 26.68 kg/m     Physical Exam General: Sitting in chair, in no acute distress Eyes: EOMI, no icterus Neck: Supple, no JVP Pulmonary: Clear, distant, normal work of breathing Cardiovascular: Warm, no edema Abdomen: Nondistended, bowel sounds present MSK: No synovitis, no joint effusion Neuro: Normal gait, no weakness Psych: Normal mood, full affect   Assessment & Plan:   Dyspnea on exertion: Suspect multifactorial.  She describes worsening after COVID infection.  In addition, worsening with metoprolol.  High suspicion for development of asthma given worsening symptoms with beta-blockade, chest tightness etc., as well as symptoms post-COVID,  postviral activation of asthma.  In addition, she has fairly severe emphysema on CT scan.  Recommend bronchodilators to help with both, Stiolto 2 puffs once daily.  New prescription today.  Pulmonary function test for further evaluation.  Lung nodule: 5 mm.  Stable in 1 year follow-up 07/2022.  No further follow-up imaging needed.  Tobacco abuse in remission: She is outside the window for lung cancer screening given last cigarette  20+ years ago.   Return in about 3 months (around 12/23/2022).   Karren Burly, MD 09/22/2022   This appointment required 62 minutes of patient care (this includes precharting, chart review, review of results, face-to-face care, etc.).

## 2022-09-30 DIAGNOSIS — E7800 Pure hypercholesterolemia, unspecified: Secondary | ICD-10-CM | POA: Diagnosis not present

## 2022-09-30 DIAGNOSIS — M85851 Other specified disorders of bone density and structure, right thigh: Secondary | ICD-10-CM | POA: Diagnosis not present

## 2022-10-04 DIAGNOSIS — E7800 Pure hypercholesterolemia, unspecified: Secondary | ICD-10-CM | POA: Diagnosis not present

## 2022-10-04 DIAGNOSIS — D649 Anemia, unspecified: Secondary | ICD-10-CM | POA: Diagnosis not present

## 2022-10-04 DIAGNOSIS — Z0000 Encounter for general adult medical examination without abnormal findings: Secondary | ICD-10-CM | POA: Diagnosis not present

## 2022-10-04 DIAGNOSIS — I6523 Occlusion and stenosis of bilateral carotid arteries: Secondary | ICD-10-CM | POA: Diagnosis not present

## 2022-10-04 DIAGNOSIS — I480 Paroxysmal atrial fibrillation: Secondary | ICD-10-CM | POA: Diagnosis not present

## 2022-10-04 DIAGNOSIS — J439 Emphysema, unspecified: Secondary | ICD-10-CM | POA: Diagnosis not present

## 2022-10-04 DIAGNOSIS — D6869 Other thrombophilia: Secondary | ICD-10-CM | POA: Diagnosis not present

## 2022-10-04 DIAGNOSIS — Z23 Encounter for immunization: Secondary | ICD-10-CM | POA: Diagnosis not present

## 2022-10-04 DIAGNOSIS — E041 Nontoxic single thyroid nodule: Secondary | ICD-10-CM | POA: Diagnosis not present

## 2022-12-02 ENCOUNTER — Other Ambulatory Visit: Payer: Self-pay | Admitting: Family Medicine

## 2022-12-02 DIAGNOSIS — Z1231 Encounter for screening mammogram for malignant neoplasm of breast: Secondary | ICD-10-CM

## 2022-12-07 ENCOUNTER — Other Ambulatory Visit: Payer: Self-pay

## 2022-12-07 DIAGNOSIS — J439 Emphysema, unspecified: Secondary | ICD-10-CM

## 2022-12-07 DIAGNOSIS — R0609 Other forms of dyspnea: Secondary | ICD-10-CM

## 2022-12-08 ENCOUNTER — Ambulatory Visit: Payer: Medicare Other | Admitting: Pulmonary Disease

## 2022-12-08 ENCOUNTER — Ambulatory Visit (INDEPENDENT_AMBULATORY_CARE_PROVIDER_SITE_OTHER): Payer: Medicare Other | Admitting: Pulmonary Disease

## 2022-12-08 ENCOUNTER — Encounter: Payer: Self-pay | Admitting: Pulmonary Disease

## 2022-12-08 VITALS — BP 126/72 | HR 84 | Ht 61.0 in | Wt 135.4 lb

## 2022-12-08 DIAGNOSIS — J439 Emphysema, unspecified: Secondary | ICD-10-CM

## 2022-12-08 DIAGNOSIS — R0609 Other forms of dyspnea: Secondary | ICD-10-CM | POA: Diagnosis not present

## 2022-12-08 LAB — PULMONARY FUNCTION TEST
DL/VA % pred: 62 %
DL/VA: 2.67 ml/min/mmHg/L
DLCO cor % pred: 56 %
DLCO cor: 9.86 ml/min/mmHg
DLCO unc % pred: 56 %
DLCO unc: 9.86 ml/min/mmHg
FEF 25-75 Post: 0.34 L/sec
FEF 25-75 Pre: 0.32 L/sec
FEF2575-%Change-Post: 5 %
FEF2575-%Pred-Post: 19 %
FEF2575-%Pred-Pre: 18 %
FEV1-%Change-Post: 0 %
FEV1-%Pred-Post: 41 %
FEV1-%Pred-Pre: 41 %
FEV1-Post: 0.83 L
FEV1-Pre: 0.83 L
FEV1FVC-%Change-Post: -2 %
FEV1FVC-%Pred-Pre: 61 %
FEV6-%Change-Post: 2 %
FEV6-%Pred-Post: 69 %
FEV6-%Pred-Pre: 68 %
FEV6-Post: 1.76 L
FEV6-Pre: 1.71 L
FEV6FVC-%Change-Post: 0 %
FEV6FVC-%Pred-Post: 99 %
FEV6FVC-%Pred-Pre: 100 %
FVC-%Change-Post: 3 %
FVC-%Pred-Post: 69 %
FVC-%Pred-Pre: 67 %
FVC-Post: 1.85 L
FVC-Pre: 1.79 L
Post FEV1/FVC ratio: 45 %
Post FEV6/FVC ratio: 95 %
Pre FEV1/FVC ratio: 47 %
Pre FEV6/FVC Ratio: 96 %
RV % pred: 210 %
RV: 4.28 L
TLC % pred: 137 %
TLC: 6.33 L

## 2022-12-08 MED ORDER — STIOLTO RESPIMAT 2.5-2.5 MCG/ACT IN AERS: 2.0000 | INHALATION_SPRAY | Freq: Every day | RESPIRATORY_TRACT | 11 refills | Status: DC

## 2022-12-08 NOTE — Patient Instructions (Signed)
Full PFT performed today. °

## 2022-12-08 NOTE — Patient Instructions (Addendum)
It is nice to see you again  Your breathing test to confirm the presence of COPD  The Stiolto I think is a good choice to try to continue to help her shortness of breath based on the results of your breathing test  I refilled this for 1 year  Also provided samples today of this and Eliquis  Return to clinic in 6 months or sooner as needed with Dr. Judeth Horn

## 2022-12-08 NOTE — Progress Notes (Signed)
Full PFT performed today. °

## 2022-12-08 NOTE — Progress Notes (Signed)
@Patient  ID: Karna Dupes, female    DOB: 08-Apr-1951, 72 y.o.   MRN: [id number]  Chief Complaint  Patient presents with   Follow-up    F/U after PFT. States the inhaler has been working well for her.     Referring provider: Tally Joe, MD  HPI:   72 y.o. woman whom we are seeing for evaluation of dyspnea on exertion.    Returns for planned follow up. At last visit was started on Stiolto to see if helps with DOE.  Both patient and husband thinks that this is been beneficial.  Additional stop beta-blocker which also seems to have helped.  PFTs performed today.  Spirometry consistent with severe fixed obstruction.  No bronchodilator spots.  Lung volumes consistent with air trapping and hyperinflation.  DLCO moderately reduced.  Results explained in detail.  HPI at initial visit: She notes getting COVID infection.  She cannot tell me exactly when but over the last couple of years.  Since then she has had shortness of breath.  Chest tightness occasionally.  More short of breath when walking on flat surfaces.  She exercises regularly and finds it sometimes as little more challenging.  Walks on the treadmill at different speeds and different inclines.  Continue to do this 3 times a day.  She has undergone CT scans for evaluation.  The first CT scans 07/2021 that revealed significant emphysema as well as small 5 mm nodule.  Subsequent repeat CT scan in 07/2022 revealed stable nodules, again severe emphysematous changes on my review and interpretation.  No time of day when things are better or worse.  No position make things better or worse.  No seasonal or environmental factors she can identify to make things better or worse.  She said that when she would take metoprolol her chest will feel tight.  More symptomatic.  She feels better since stopping the metoprolol.  We discussed given beta-blockade causing chest tightness and worsening symptoms as well as symptoms starting after viral  infection, likely postviral activation of asthma.  We discussed role and rationale for pulmonary function test to help further define any issues with breathing as well as to better care for her in the future.    Questionaires / Pulmonary Flowsheets:   ACT:      No data to display          MMRC:     No data to display          Epworth:      No data to display          Tests:   FENO:  No results found for: "NITRICOXIDE"  PFT:    Latest Ref Rng & Units 12/07/2022   11:26 AM  PFT Results  FVC-Pre L 1.79  P  FVC-Predicted Pre % 67  P  FVC-Post L 1.85  P  FVC-Predicted Post % 69  P  Pre FEV1/FVC % % 47  P  Post FEV1/FCV % % 45  P  FEV1-Pre L 0.83  P  FEV1-Predicted Pre % 41  P  FEV1-Post L 0.83  P  DLCO uncorrected ml/min/mmHg 9.86  P  DLCO UNC% % 56  P  DLCO corrected ml/min/mmHg 9.86  P  DLCO COR %Predicted % 56  P  DLVA Predicted % 62  P  TLC L 6.33  P  TLC % Predicted % 137  P  RV % Predicted % 210  P    P Preliminary result  Spirometry consistent with severe fixed obstruction.  No bronchodilator spots.  Lung volumes consistent with air trapping and hyperinflation.  DLCO moderately reduced.   WALK:      No data to display          Imaging: Personally reviewed and as per EMR and discussion in this note No results found.  Lab Results: Personally reviewed CBC    Component Value Date/Time   WBC 5.5 08/01/2022 1613   WBC 9.4 07/23/2021 0620   RBC 4.21 08/01/2022 1613   RBC 4.20 07/23/2021 0620   HGB 12.9 08/01/2022 1613   HCT 39.2 08/01/2022 1613   PLT 242 08/01/2022 1613   MCV 93 08/01/2022 1613   MCH 30.6 08/01/2022 1613   MCH 31.4 07/23/2021 0620   MCHC 32.9 08/01/2022 1613   MCHC 33.2 07/23/2021 0620   RDW 12.3 08/01/2022 1613   LYMPHSABS 1.5 07/23/2021 0620   MONOABS 0.5 07/23/2021 0620   EOSABS 0.1 07/23/2021 0620   BASOSABS 0.0 07/23/2021 0620    BMET    Component Value Date/Time   NA 141 08/01/2022 1613   K 4.5  08/01/2022 1613   CL 101 08/01/2022 1613   CO2 24 08/01/2022 1613   GLUCOSE 81 08/01/2022 1613   GLUCOSE 89 07/23/2021 0620   BUN 12 08/01/2022 1613   CREATININE 0.82 08/01/2022 1613   CALCIUM 9.4 08/01/2022 1613   GFRNONAA >60 07/23/2021 0620    BNP No results found for: "BNP"  ProBNP No results found for: "PROBNP"  Specialty Problems   None   Allergies  Allergen Reactions   Ibuprofen Nausea Only    Immunization History  Administered Date(s) Administered   PFIZER(Purple Top)SARS-COV-2 Vaccination 04/04/2020    Past Medical History:  Diagnosis Date   Allergy    seasonal   Cataract    bilateral removed   Hyperlipidemia    Osteopenia    Osteoporosis     Tobacco History: Social History   Tobacco Use  Smoking Status Former  Smokeless Tobacco Never   Counseling given: Not Answered   Continue to not smoke  Outpatient Encounter Medications as of 12/08/2022  Medication Sig   apixaban (ELIQUIS) 5 MG TABS tablet Take 1 tablet (5 mg total) by mouth 2 (two) times daily.   Calcium Carb-Cholecalciferol (CALCIUM 600+D3 PO) Take 1 tablet by mouth daily with breakfast.   famotidine (PEPCID) 40 MG tablet 1 tablet   fexofenadine (ALLEGRA) 180 MG tablet Take 180 mg by mouth daily as needed (for seasonal allergies).   omeprazole (PRILOSEC) 40 MG capsule Take 40 mg by mouth every morning.   raloxifene (EVISTA) 60 MG tablet Take 60 mg by mouth in the morning.   rosuvastatin (CRESTOR) 10 MG tablet Take 10 mg by mouth in the morning.   [DISCONTINUED] Tiotropium Bromide-Olodaterol (STIOLTO RESPIMAT) 2.5-2.5 MCG/ACT AERS Inhale 2 puffs into the lungs daily.   Tiotropium Bromide-Olodaterol (STIOLTO RESPIMAT) 2.5-2.5 MCG/ACT AERS Inhale 2 puffs into the lungs daily.   No facility-administered encounter medications on file as of 12/08/2022.     Review of Systems  Review of Systems  N/a Physical Exam  BP 126/72   Pulse 84   Ht 5\' 1"  (1.549 m)   Wt 135 lb 6.4 oz (61.4 kg)    SpO2 98% Comment: on RA  BMI 25.58 kg/m   Wt Readings from Last 5 Encounters:  12/08/22 135 lb 6.4 oz (61.4 kg)  09/22/22 138 lb 9.6 oz (62.9 kg)  09/02/22 142 lb 12.8 oz (64.8  kg)  08/04/22 143 lb (64.9 kg)  08/01/22 143 lb 9.6 oz (65.1 kg)    BMI Readings from Last 5 Encounters:  12/08/22 25.58 kg/m  09/22/22 26.19 kg/m  09/02/22 26.98 kg/m  08/04/22 27.02 kg/m  08/01/22 27.13 kg/m     Physical Exam General: Sitting in chair, in no acute distress Eyes: EOMI, no icterus Neck: Supple, no JVP Pulmonary: Clear, distant, normal work of breathing Cardiovascular: Warm, no edema Abdomen: Nondistended, bowel sounds present MSK: No synovitis, no joint effusion Neuro: Normal gait, no weakness Psych: Normal mood, full affect   Assessment & Plan:   Dyspnea on exertion: Suspect multifactorial.  She describes worsening after COVID infection.  In addition, worsening with metoprolol.  High suspicion for development of asthma given worsening symptoms with beta-blockade, chest tightness etc., as well as symptoms post-COVID, postviral activation of asthma.  In addition, she has fairly severe emphysema on CT scan.  Recommend bronchodilators to help with both, Stiolto 2 puffs once daily.  PFT 11/2022 confirms COPD, air trapping, hyperinflation.  Improved with Stiolto.  Stiolto refilled today.  Severe COPD: Based on pulmonary function test 11/2022.  Symptoms markedly improved with DLCO.  Also removal of beta-blockade in setting of atrial fibrillation.  Stiolto.  Lung nodule: 5 mm.  Stable in 1 year follow-up 07/2022.  No further follow-up imaging needed.  Tobacco abuse in remission: She is outside the window for lung cancer screening given last cigarette 20+ years ago.   Return in about 6 months (around 06/10/2023) for f/u Dr. Judeth Horn.   Karren Burly, MD 12/08/2022

## 2023-01-12 ENCOUNTER — Ambulatory Visit
Admission: RE | Admit: 2023-01-12 | Discharge: 2023-01-12 | Disposition: A | Discharge status: Home / Self Care | Payer: Medicare Other | Source: Ambulatory Visit | Attending: Family Medicine | Admitting: Family Medicine

## 2023-01-12 DIAGNOSIS — Z1231 Encounter for screening mammogram for malignant neoplasm of breast: Secondary | ICD-10-CM | POA: Diagnosis not present

## 2023-02-01 ENCOUNTER — Other Ambulatory Visit: Payer: Self-pay

## 2023-02-01 ENCOUNTER — Ambulatory Visit: Payer: Medicare Other | Attending: Cardiology | Admitting: Cardiology

## 2023-02-01 ENCOUNTER — Encounter: Payer: Self-pay | Admitting: Cardiology

## 2023-02-01 VITALS — BP 116/62 | HR 85 | Ht 61.0 in | Wt 136.0 lb

## 2023-02-01 DIAGNOSIS — R911 Solitary pulmonary nodule: Secondary | ICD-10-CM

## 2023-02-01 DIAGNOSIS — I2510 Atherosclerotic heart disease of native coronary artery without angina pectoris: Secondary | ICD-10-CM

## 2023-02-01 DIAGNOSIS — I6523 Occlusion and stenosis of bilateral carotid arteries: Secondary | ICD-10-CM

## 2023-02-01 DIAGNOSIS — I480 Paroxysmal atrial fibrillation: Secondary | ICD-10-CM | POA: Diagnosis not present

## 2023-02-01 DIAGNOSIS — E785 Hyperlipidemia, unspecified: Secondary | ICD-10-CM

## 2023-02-01 LAB — LIPID PANEL
Chol/HDL Ratio: 2.7 ratio (ref 0.0–4.4)
Cholesterol, Total: 199 mg/dL (ref 100–199)
HDL: 75 mg/dL (ref >39)
LDL Chol Calc (NIH): 97 mg/dL (ref 0–99)
Triglycerides: 162 mg/dL — ABNORMAL HIGH (ref 0–149)
VLDL Cholesterol Cal: 27 mg/dL (ref 5–40)

## 2023-02-01 NOTE — Patient Instructions (Signed)
Medication Instructions:  The current medical regimen is effective;  continue present plan and medications as directed. Please refer to the Current Medication list given to you today. *If you need a refill on your cardiac medications before your next appointment, please call your pharmacy*  Lab Work: LIPID PANEL TODAY If you have labs (blood work) drawn today and your tests are completely normal, you will receive your results only by:  MyChart Message (if you have MyChart) OR  A paper copy in the mail If you have any lab test that is abnormal or we need to change your treatment, we will call you to review the results.  Testing/Procedures: Your physician has requested that you have a carotid duplex. This test is an ultrasound of the carotid arteries in your neck. It looks at blood flow through these arteries that supply the brain with blood. Allow one hour for this exam. There are no restrictions or special instructions.   Follow-Up: At [facility], you and your health needs are our priority.  As part of our continuing mission to provide you with exceptional heart care, we have created designated Provider Care Teams.  These Care Teams include your primary Cardiologist (physician) and Advanced Practice Providers (APPs -  Physician Assistants and Nurse Practitioners) who all work together to provide you with the care you need, when you need it.  We recommend signing up for the patient portal called "MyChart".  Sign up information is provided on this After Visit Summary.  MyChart is used to connect with patients for Virtual Visits (Telemedicine).  Patients are able to view lab/test results, encounter notes, upcoming appointments, etc.  Non-urgent messages can be sent to your provider as well.   To learn more about what you can do with MyChart, go to [email or web address].    Your next appointment:   6 month(s)  Provider:   Little Ishikawa, MD     Other Instructions

## 2023-02-01 NOTE — Progress Notes (Unsigned)
Cardiology Office Note:    Date:  02/02/2023   ID:  Shelbylyn [name], DOB [birth year], MRN [id number]  PCP:  Tally Joe, MD  Cardiologist:  Little Ishikawa, MD  Electrophysiologist:  None   Referring MD: Tally Joe, MD   Chief Complaint  Patient presents with   Atrial Fibrillation    History of Present Illness:    Evelyn [name] is a 72 y.o. female with a hx of hyperlipidemia, paroxysmal atrial fibrillation who presents for follow-up.  She was referred by Dr. Nicholos Johns for evaluation of atrial fibrillation, initially seen 08/05/2021.  She was admitted from 3/3 through 07/24/2021 with chest pain.  Troponins negative.  She was found to have paroxysmal atrial fibrillation while admitted.  Echocardiogram 07/23/2021 showed normal biventricular function, no significant valvular disease.  Started on metoprolol and Eliquis.  Coronary CTA on 08/19/2021 showed nonobstructive CAD, calcium score 690 (96 percentile).  Zio patch x10 days on 08/25/2021 showed 1 episode of NSVT lasting 10 beats, 16 episodes of SVT with longest lasting 19 beats.  She reported worsening dyspnea and underwent exercise Myoview 07/2022, which showed poor exercise capacity (5.3 METS), normal perfusion, EF 73%.  Since last clinic visit, she reports she is doing well.  She denies any chest pain.  Does report some dyspnea with exertion, has improved significantly since seeing pulmonology and starting on inhaler.  She denies any lightheadedness, syncope, lower extremity edema, or palpitations.  She is taking Eliquis, denies any bleeding issues.  Past Medical History:  Diagnosis Date   Allergy    seasonal   Cataract    bilateral removed   Hyperlipidemia    Osteopenia    Osteoporosis     Past Surgical History:  Procedure Laterality Date   ABDOMINAL HYSTERECTOMY     CARPAL TUNNEL RELEASE Bilateral    CATARACT EXTRACTION Bilateral    CESAREAN SECTION     x2   COLONOSCOPY     UTERINE FIBROID SURGERY  1999     Current Medications: Current Meds  Medication Sig   apixaban (ELIQUIS) 5 MG TABS tablet Take 1 tablet (5 mg total) by mouth 2 (two) times daily.   Calcium Carb-Cholecalciferol (CALCIUM 600+D3 PO) Take 1 tablet by mouth daily with breakfast.   famotidine (PEPCID) 40 MG tablet 1 tablet   fexofenadine (ALLEGRA) 180 MG tablet Take 180 mg by mouth daily as needed (for seasonal allergies).   omeprazole (PRILOSEC) 40 MG capsule Take 40 mg by mouth every morning.   raloxifene (EVISTA) 60 MG tablet Take 60 mg by mouth in the morning.   rosuvastatin (CRESTOR) 10 MG tablet Take 10 mg by mouth in the morning.   Tiotropium Bromide-Olodaterol (STIOLTO RESPIMAT) 2.5-2.5 MCG/ACT AERS Inhale 2 puffs into the lungs daily.     Allergies:   Ibuprofen   Social History   Socioeconomic History   Marital status: Married    Spouse name: Not on file   Number of children: Not on file   Years of education: Not on file   Highest education level: Not on file  Occupational History   Not on file  Tobacco Use   Smoking status: Former   Smokeless tobacco: Never  Vaping Use   Vaping status: Never Used  Substance and Sexual Activity   Alcohol use: Not Currently    Comment: occa   Drug use: No   Sexual activity: Not on file  Other Topics Concern   Not on file  Social History Narrative  Not on file   Social Determinants of Health   Financial Resource Strain: Not on file  Food Insecurity: Not on file  Transportation Needs: Not on file  Physical Activity: Not on file  Stress: Not on file  Social Connections: Not on file     Family History: The patient's family history is negative for Colon cancer, Colon polyps, Esophageal cancer, Rectal cancer, and Stomach cancer.  ROS:   Please see the history of present illness.     All other systems reviewed and are negative.  EKGs/Labs/Other Studies Reviewed:    The following studies were reviewed today:   EKG:   02/01/2023: Normal sinus rhythm, rate  85, no ST abnormalities 08/01/22: Normal sinus rhythm, rate 71, no ST abnormalities 01/31/22: NSR, rate 67, no ST abnormalities 07/26/21: Normal sinus rhythm, rate 68, no ST abnormalities  Recent Labs: 08/01/2022: ALT 14; BUN 12; Creatinine, Ser 0.82; Hemoglobin 12.9; Platelets 242; Potassium 4.5; Sodium 141  Recent Lipid Panel    Component Value Date/Time   CHOL 199 02/01/2023 0934   TRIG 162 (H) 02/01/2023 0934   HDL 75 02/01/2023 0934   CHOLHDL 2.7 02/01/2023 0934   CHOLHDL 2.0 07/24/2021 0427   VLDL 20 07/24/2021 0427   LDLCALC 97 02/01/2023 0934    Physical Exam:    VS:  BP 116/62 (BP Location: Left Arm, Patient Position: Sitting, Cuff Size: Normal)   Pulse 85   Ht 5\' 1"  (1.549 m)   Wt 136 lb (61.7 kg)   BMI 25.70 kg/m     Wt Readings from Last 3 Encounters:  02/01/23 136 lb (61.7 kg)  12/08/22 135 lb 6.4 oz (61.4 kg)  09/22/22 138 lb 9.6 oz (62.9 kg)     GEN:  Well nourished, well developed in no acute distress HEENT: Normal NECK: No JVD; right carotid bruit LYMPHATICS: No lymphadenopathy CARDIAC: RRR, no murmurs, rubs, gallops RESPIRATORY:  Clear to auscultation without rales, wheezing or rhonchi  ABDOMEN: Soft, non-tender, non-distended MUSCULOSKELETAL:  No edema; No deformity  SKIN: Warm and dry NEUROLOGIC:  Alert and oriented x 3 PSYCHIATRIC:  Normal affect   ASSESSMENT:    1. CAD in native artery   2. Paroxysmal atrial fibrillation (HCC)   3. Bilateral carotid artery stenosis   4. Hyperlipidemia, unspecified hyperlipidemia type   5. Lung nodule      PLAN:    CAD: Reported atypical chest pain.  Coronary CTA on 08/19/2021 showed nonobstructive CAD, calcium score 690 (96 percentile).  Echocardiogram 07/23/2021 showed normal biventricular function, no significant valvular disease.   She reported worsening dyspnea and underwent exercise Myoview 07/2022, which showed poor exercise capacity (5.3 METS), normal perfusion, EF 73%.   -Continue rosuvastatin 10 mg  daily -She is on Eliquis for atrial fibrillation, does not need aspirin -Suspect dyspnea is from COPD, referred to pulmonology. Reports dyspnea has improved since starting treatment.  Paroxysmal atrial fibrillation: CHA2DS2-VASc score 2 (age, female).  Echocardiogram 07/23/2021 showed normal biventricular function, no significant valvular disease.  Zio patch x10 days on 08/25/2021 showed 1 episode of NSVT lasting 10 beats, 16 episodes of SVT with longest lasting 19 beats, no atrial fibrillation -Continue Eliquis 5 mg twice daily -Did not tolerate metoprolol, reported feeling fatigued and short of breath which improved with stopping metoprolol.  Will hold off on AV nodal blockers for now.  Recommend a Kardia mobile device to monitor for recurrent A-fib  Hyperlipidemia: On rosuvastatin 10 mg daily, LDL 94 on 08/01/2022.  Check lipid panel.  If  LDL remains above 70, would increase rosuvastatin to 20 mg daily  Carotid stenosis: 50 to 69% stenosis in right ICA 07/2018.  Carotid duplex 01/2022 showed 40 to 59% right carotid stenosis, 1 to 39% left carotid stenosis. -Continue Eliquis, statin -Check duplex for monitoring  Pulmonary nodule: 2 mm left lower lobe lung nodule seen on coronary CTA 08/19/2021.  Repeat CT chest 07/2022 showed unchanged pulmonary nodules, no follow-up needed.  However also noted to have moderate emphysema and given degree of emphysema, annual lung cancer screening CTs could be considered.  Referred to pulmonology  RTC in 6 months   Medication Adjustments/Labs and Tests Ordered: Current medicines are reviewed at length with the patient today.  Concerns regarding medicines are outlined above.  Orders Placed This Encounter  Procedures   Lipid Profile   EKG 12-Lead   VAS US CAROTID   No orders of the defined types were placed in this encounter.   Patient Instructions  Medication Instructions:  The current medical regimen is effective;  continue present plan and medications as  directed. Please refer to the Current Medication list given to you today. *If you need a refill on your cardiac medications before your next appointment, please call your pharmacy*  Lab Work: LIPID PANEL TODAY If you have labs (blood work) drawn today and your tests are completely normal, you will receive your results only by:  MyChart Message (if you have MyChart) OR  A paper copy in the mail If you have any lab test that is abnormal or we need to change your treatment, we will call you to review the results.  Testing/Procedures: Your physician has requested that you have a carotid duplex. This test is an ultrasound of the carotid arteries in your neck. It looks at blood flow through these arteries that supply the brain with blood. Allow one hour for this exam. There are no restrictions or special instructions.   Follow-Up: At [facility], you and your health needs are our priority.  As part of our continuing mission to provide you with exceptional heart care, we have created designated Provider Care Teams.  These Care Teams include your primary Cardiologist (physician) and Advanced Practice Providers (APPs -  Physician Assistants and Nurse Practitioners) who all work together to provide you with the care you need, when you need it.  We recommend signing up for the patient portal called "MyChart".  Sign up information is provided on this After Visit Summary.  MyChart is used to connect with patients for Virtual Visits (Telemedicine).  Patients are able to view lab/test results, encounter notes, upcoming appointments, etc.  Non-urgent messages can be sent to your provider as well.   To learn more about what you can do with MyChart, go to [email or web address].    Your next appointment:   6 month(s)  Provider:   Little Ishikawa, MD     Other Instructions    Signed, Little Ishikawa, MD  02/02/2023 10:58 AM    [facility]

## 2023-02-02 ENCOUNTER — Ambulatory Visit (HOSPITAL_COMMUNITY)
Admission: RE | Admit: 2023-02-02 | Discharge: 2023-02-02 | Disposition: A | Discharge status: Home / Self Care | Payer: Medicare Other | Source: Ambulatory Visit | Attending: Internal Medicine | Admitting: Internal Medicine

## 2023-02-02 ENCOUNTER — Other Ambulatory Visit: Payer: Self-pay | Admitting: *Deleted

## 2023-02-02 DIAGNOSIS — I6523 Occlusion and stenosis of bilateral carotid arteries: Secondary | ICD-10-CM | POA: Diagnosis not present

## 2023-02-02 MED ORDER — ROSUVASTATIN CALCIUM 20 MG PO TABS: 20.0000 mg | ORAL_TABLET | Freq: Every day | ORAL | 3 refills | Status: DC

## 2023-02-02 NOTE — Progress Notes (Signed)
Per result note from Dr Bjorn Pippin.  E-sent Medication - rosuvastatin 20 mg daily

## 2023-02-03 ENCOUNTER — Other Ambulatory Visit: Payer: Self-pay

## 2023-02-03 ENCOUNTER — Telehealth: Payer: Self-pay | Admitting: Cardiology

## 2023-02-03 MED ORDER — APIXABAN 5 MG PO TABS: 5.0000 mg | ORAL_TABLET | Freq: Two times a day (BID) | ORAL | 1 refills | Status: DC

## 2023-02-03 NOTE — Telephone Encounter (Signed)
Prescription refill request for Eliquis received. Indication:afib Last office visit:9/24 Scr:0.82  3/24 Age: 72 Weight:61.7  kg  Prescription refilled

## 2023-02-03 NOTE — Telephone Encounter (Signed)
*  STAT* If patient is at the pharmacy, call can be transferred to refill team.   1. Which medications need to be refilled? (please list name of each medication and dose if known) apixaban (ELIQUIS) 5 MG TABS tablet   2. Which pharmacy/location (including street and city if local pharmacy) is medication to be sent to?  [id number] [place], [place] - [address]      3. Do they need a 30 day or 90 day supply? 90 day     Pt is out of medication

## 2023-04-29 IMAGING — MG MM DIGITAL SCREENING BILAT W/ TOMO AND CAD
8 series · 9 of 24 positions shown · non-contrast
Comparison: Previous exam(s).

CLINICAL DATA: Screening.

EXAM:
DIGITAL SCREENING BILATERAL MAMMOGRAM WITH TOMOSYNTHESIS AND CAD
TECHNIQUE: Bilateral screening digital craniocaudal and mediolateral oblique
mammograms were obtained. Bilateral screening digital breast
tomosynthesis was performed. The images were evaluated with
computer-aided detection.

[R CC synth-2D]
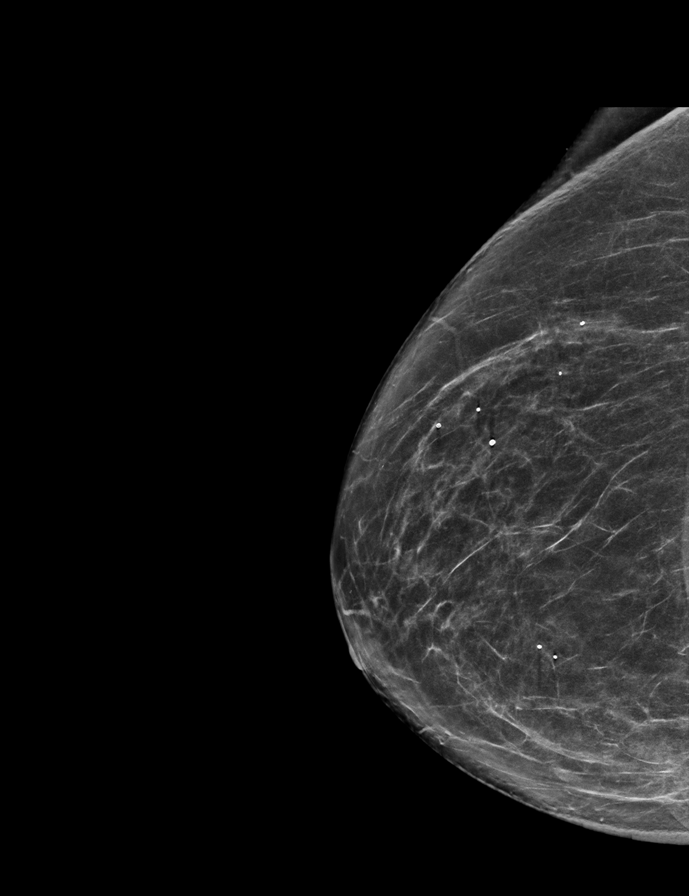

[R MLO synth-2D]
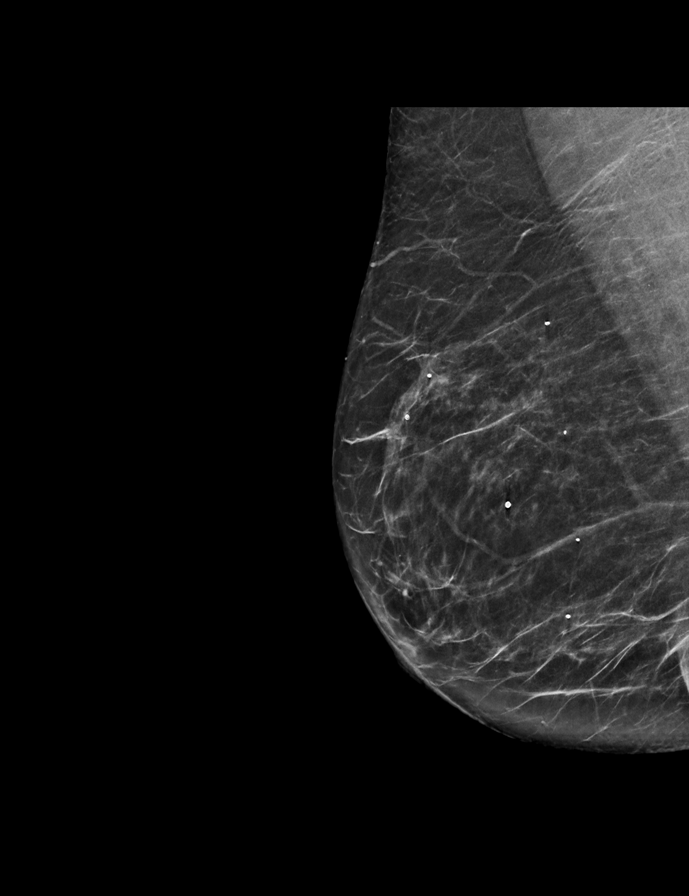

[L CC synth-2D]
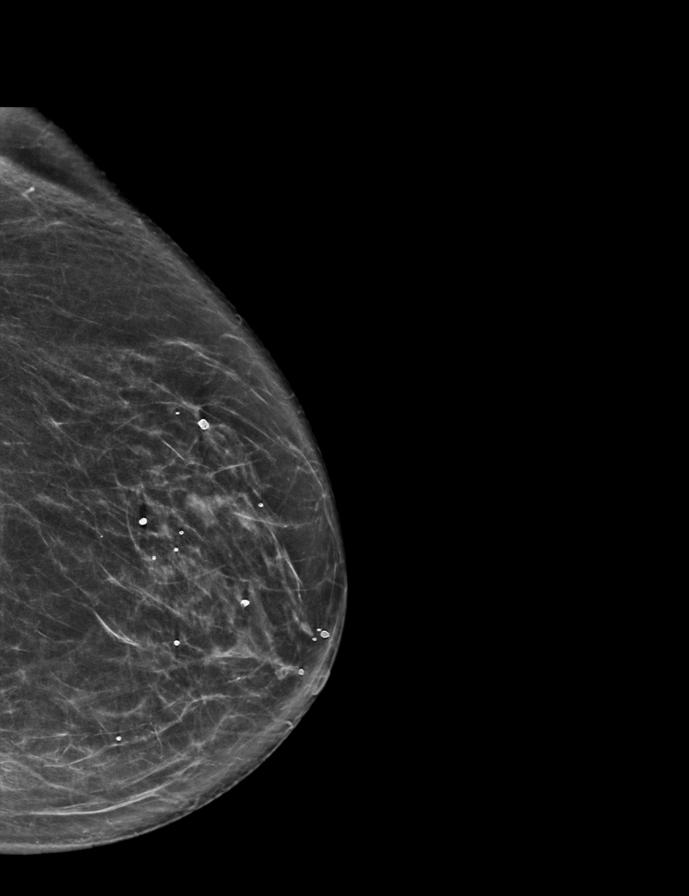

[L MLO synth-2D]
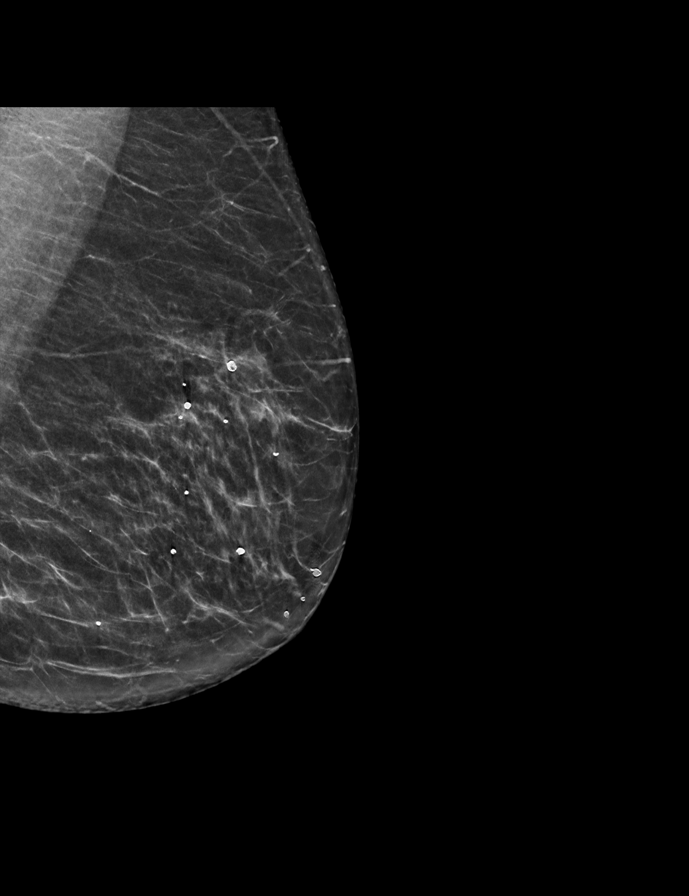

[R CC tomo · 2 of 69 frames shown]
[frame 23/69]
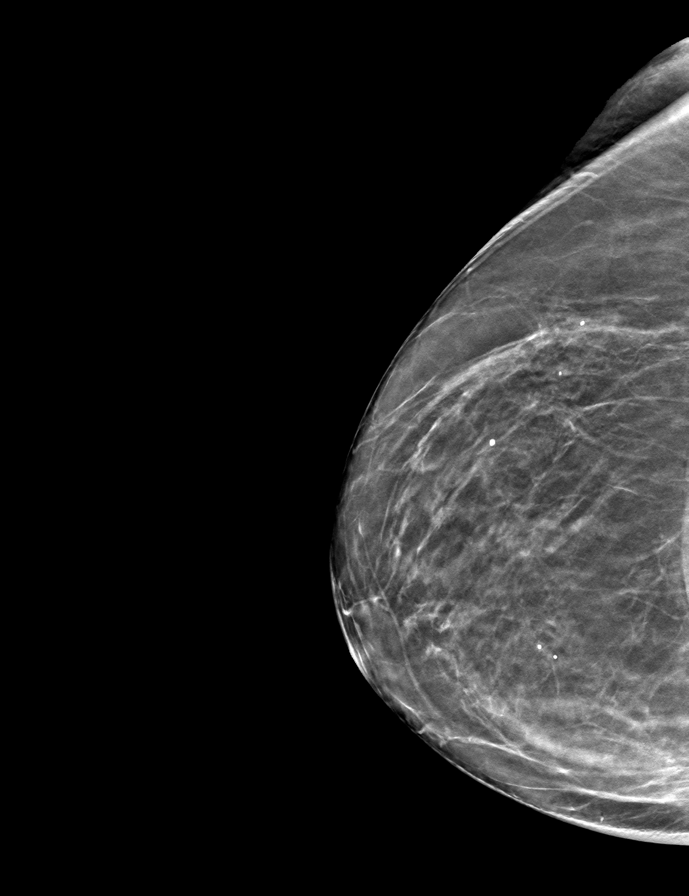
[frame 35/69]
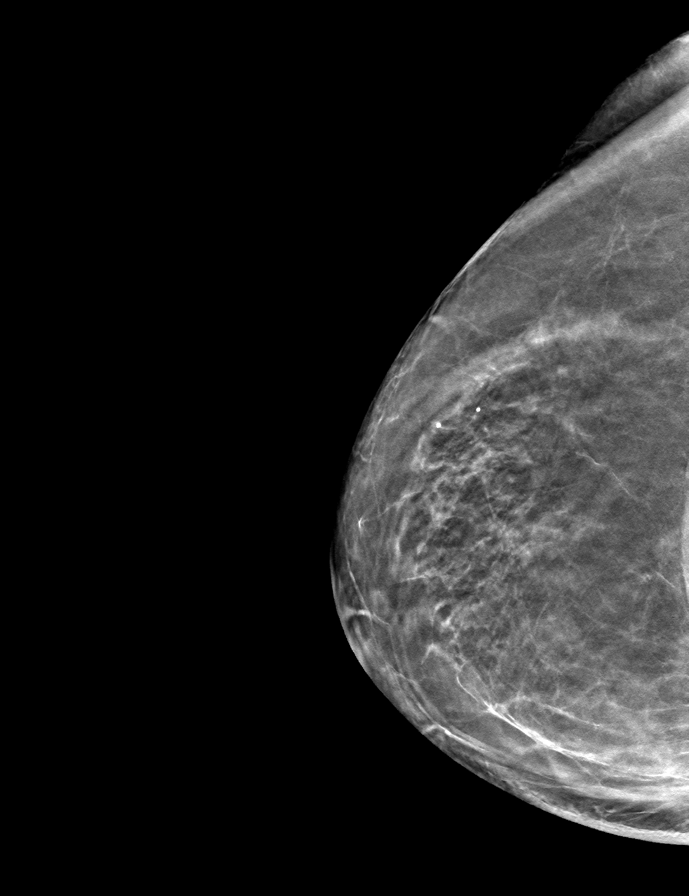

[L MLO tomo · tomo slice 33/65.0]
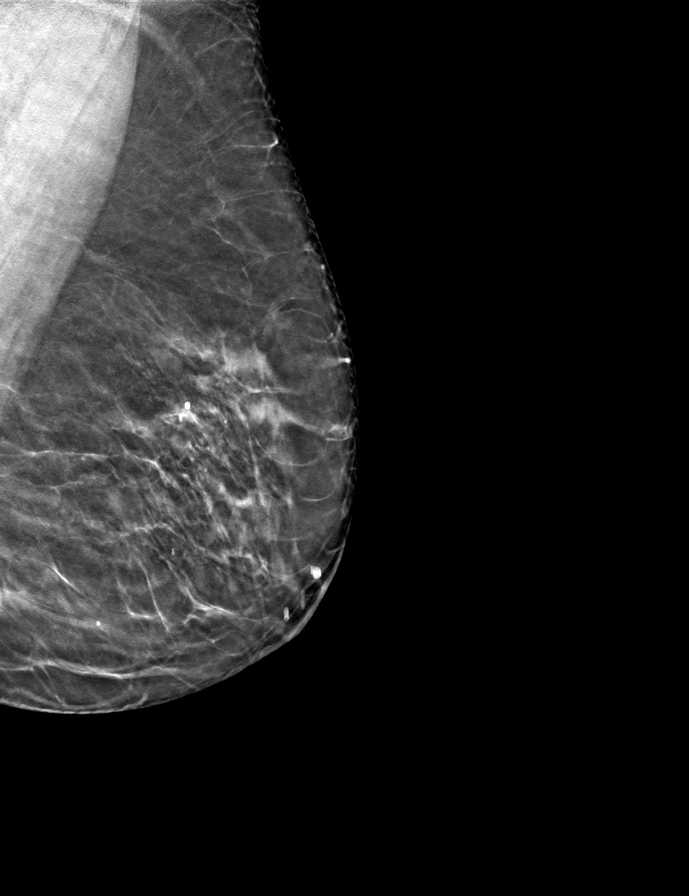

[R MLO tomo · tomo slice 35/68.0]
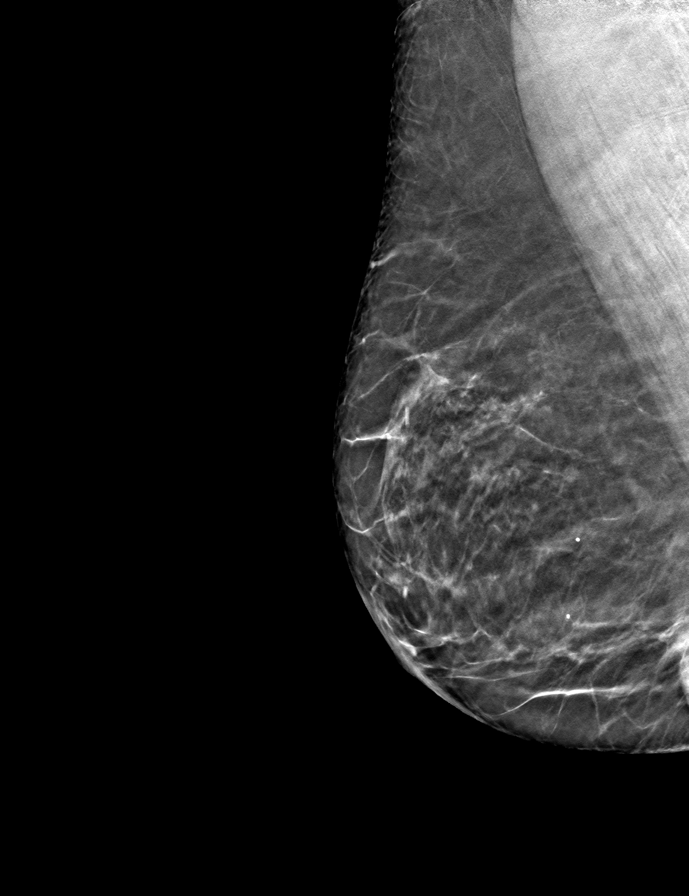

[L CC tomo · tomo slice 34/67.0]
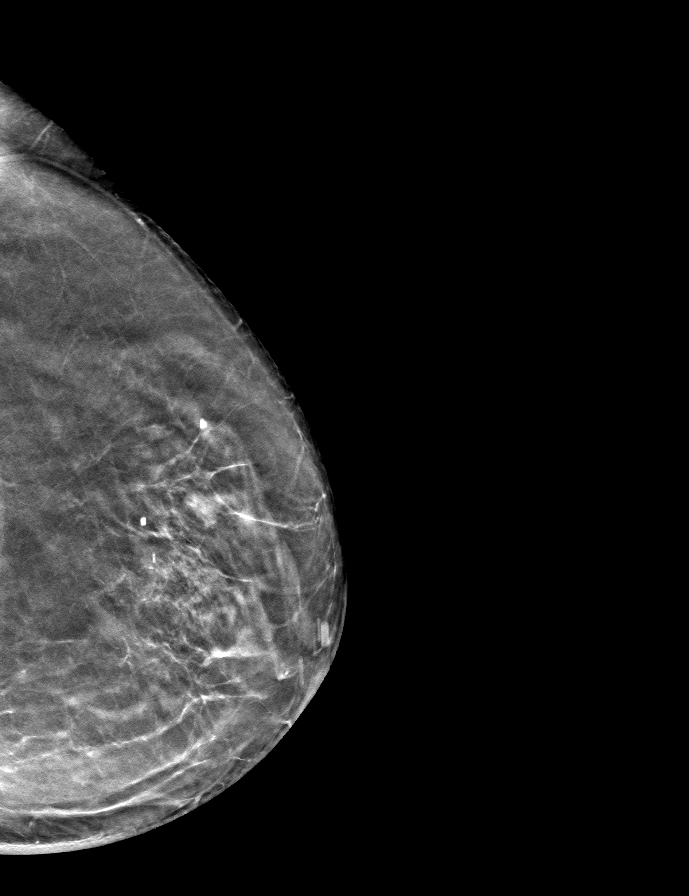

[9 of 24 positions shown; findings below may reference images not displayed]

ACR Breast Density Category b: There are scattered areas of
fibroglandular density.
FINDINGS: There are no findings suspicious for malignancy.
IMPRESSION: No mammographic evidence of malignancy. A result letter of this
screening mammogram will be mailed directly to the patient.

RECOMMENDATION:
Screening mammogram in one year. (Code:51-O-LD2)

BI-RADS CATEGORY  1: Negative.

## 2023-05-29 ENCOUNTER — Ambulatory Visit: Payer: Medicare Other | Admitting: Pulmonary Disease

## 2023-07-04 ENCOUNTER — Ambulatory Visit: Payer: Medicare Other | Admitting: Pulmonary Disease

## 2023-07-04 ENCOUNTER — Encounter: Payer: Self-pay | Admitting: Pulmonary Disease

## 2023-07-04 VITALS — BP 141/79 | HR 70 | Ht 61.0 in | Wt 144.0 lb

## 2023-07-04 DIAGNOSIS — J432 Centrilobular emphysema: Secondary | ICD-10-CM

## 2023-07-04 MED ORDER — STIOLTO RESPIMAT 2.5-2.5 MCG/ACT IN AERS: 2.0000 | INHALATION_SPRAY | Freq: Every day | RESPIRATORY_TRACT | 12 refills | Status: AC

## 2023-07-04 NOTE — Progress Notes (Signed)
@Patient  ID: Evelyn [name], female    DOB: [birth year], 73 y.o.   MRN: [id number]  Chief Complaint  Patient presents with   Follow-up    Pt states she doing well no concerns     Referring provider: Tally Joe, MD  HPI:   73 y.o. woman whom we are seeing for evaluation of dyspnea on exertion with PFT 11/2022 consistent with severe fixed obstruction.  Cardiology note reviewed.  Return to routine follow-up.  Maintained on Stiolto 2 puffs daily.  Maintaining good progress with this.  Helpful.  Continue daily times a week.  Been 2 to 3 hours each time.  Doing well.  No exacerbations in interim.  HPI at initial visit: She notes getting COVID infection.  She cannot tell me exactly when but over the last couple of years.  Since then she has had shortness of breath.  Chest tightness occasionally.  More short of breath when walking on flat surfaces.  She exercises regularly and finds it sometimes as little more challenging.  Walks on the treadmill at different speeds and different inclines.  Continue to do this 3 times a day.  She has undergone CT scans for evaluation.  The first CT scans 07/2021 that revealed significant emphysema as well as small 5 mm nodule.  Subsequent repeat CT scan in 07/2022 revealed stable nodules, again severe emphysematous changes on my review and interpretation.  No time of day when things are better or worse.  No position make things better or worse.  No seasonal or environmental factors she can identify to make things better or worse.  She said that when she would take metoprolol her chest will feel tight.  More symptomatic.  She feels better since stopping the metoprolol.  We discussed given beta-blockade causing chest tightness and worsening symptoms as well as symptoms starting after viral infection, likely postviral activation of asthma.  We discussed role and rationale for pulmonary function test to help further define any issues with breathing as well as to  better care for her in the future.    Questionaires / Pulmonary Flowsheets:   ACT:      No data to display          MMRC:     No data to display          Epworth:      No data to display          Tests:   FENO:  No results found for: "NITRICOXIDE"  PFT:    Latest Ref Rng & Units 12/07/2022   11:26 AM  PFT Results  FVC-Pre L 1.79   FVC-Predicted Pre % 67   FVC-Post L 1.85   FVC-Predicted Post % 69   Pre FEV1/FVC % % 47   Post FEV1/FCV % % 45   FEV1-Pre L 0.83   FEV1-Predicted Pre % 41   FEV1-Post L 0.83   DLCO uncorrected ml/min/mmHg 9.86   DLCO UNC% % 56   DLCO corrected ml/min/mmHg 9.86   DLCO COR %Predicted % 56   DLVA Predicted % 62   TLC L 6.33   TLC % Predicted % 137   RV % Predicted % 210   Spirometry consistent with severe fixed obstruction.  No bronchodilator spots.  Lung volumes consistent with air trapping and hyperinflation.  DLCO moderately reduced.   WALK:      No data to display          Imaging: Personally reviewed and as  per EMR and discussion in this note No results found.  Lab Results: Personally reviewed CBC    Component Value Date/Time   WBC 5.5 08/01/2022 1613   WBC 9.4 07/23/2021 0620   RBC 4.21 08/01/2022 1613   RBC 4.20 07/23/2021 0620   HGB 12.9 08/01/2022 1613   HCT 39.2 08/01/2022 1613   PLT 242 08/01/2022 1613   MCV 93 08/01/2022 1613   MCH 30.6 08/01/2022 1613   MCH 31.4 07/23/2021 0620   MCHC 32.9 08/01/2022 1613   MCHC 33.2 07/23/2021 0620   RDW 12.3 08/01/2022 1613   LYMPHSABS 1.5 07/23/2021 0620   MONOABS 0.5 07/23/2021 0620   EOSABS 0.1 07/23/2021 0620   BASOSABS 0.0 07/23/2021 0620    BMET    Component Value Date/Time   NA 141 08/01/2022 1613   K 4.5 08/01/2022 1613   CL 101 08/01/2022 1613   CO2 24 08/01/2022 1613   GLUCOSE 81 08/01/2022 1613   GLUCOSE 89 07/23/2021 0620   BUN 12 08/01/2022 1613   CREATININE 0.82 08/01/2022 1613   CALCIUM 9.4 08/01/2022 1613   GFRNONAA >60  07/23/2021 0620    BNP No results found for: "BNP"  ProBNP No results found for: "PROBNP"  Specialty Problems       Pulmonary Problems   Pulmonary emphysema (HCC)    Allergies  Allergen Reactions   Ibuprofen Nausea Only    Immunization History  Administered Date(s) Administered   Influenza-Unspecified 03/24/2023   PFIZER(Purple Top)SARS-COV-2 Vaccination 04/04/2020    Past Medical History:  Diagnosis Date   Allergy    seasonal   Cataract    bilateral removed   Hyperlipidemia    Osteopenia    Osteoporosis     Tobacco History: Social History   Tobacco Use  Smoking Status Former  Smokeless Tobacco Never   Counseling given: Not Answered   Continue to not smoke  Outpatient Encounter Medications as of 07/04/2023  Medication Sig   apixaban (ELIQUIS) 5 MG TABS tablet Take 1 tablet (5 mg total) by mouth 2 (two) times daily.   Calcium Carb-Cholecalciferol (CALCIUM 600+D3 PO) Take 1 tablet by mouth daily with breakfast.   famotidine (PEPCID) 40 MG tablet 1 tablet   fexofenadine (ALLEGRA) 180 MG tablet Take 180 mg by mouth daily as needed (for seasonal allergies).   omeprazole (PRILOSEC) 40 MG capsule Take 40 mg by mouth every morning.   raloxifene (EVISTA) 60 MG tablet Take 60 mg by mouth in the morning.   [DISCONTINUED] Tiotropium Bromide-Olodaterol (STIOLTO RESPIMAT) 2.5-2.5 MCG/ACT AERS Inhale 2 puffs into the lungs daily.   rosuvastatin (CRESTOR) 20 MG tablet Take 1 tablet (20 mg total) by mouth daily.   Tiotropium Bromide-Olodaterol (STIOLTO RESPIMAT) 2.5-2.5 MCG/ACT AERS Inhale 2 puffs into the lungs daily.   No facility-administered encounter medications on file as of 07/04/2023.     Review of Systems  Review of Systems  N/a Physical Exam  BP (!) 141/79 (BP Location: Left Arm, Patient Position: Sitting, Cuff Size: Large)   Pulse 70   Ht 5\' 1"  (1.549 m)   Wt 144 lb (65.3 kg)   SpO2 96%   BMI 27.21 kg/m   Wt Readings from Last 5 Encounters:   07/04/23 144 lb (65.3 kg)  02/01/23 136 lb (61.7 kg)  12/08/22 135 lb 6.4 oz (61.4 kg)  09/22/22 138 lb 9.6 oz (62.9 kg)  09/02/22 142 lb 12.8 oz (64.8 kg)    BMI Readings from Last 5 Encounters:  07/04/23 27.21 kg/m  02/01/23 25.70 kg/m  12/08/22 25.58 kg/m  09/22/22 26.19 kg/m  09/02/22 26.98 kg/m     Physical Exam General: Sitting in chair, in no acute distress Eyes: EOMI, no icterus Neck: Supple, no JVP Pulmonary: Clear, distant, normal work of breathing Cardiovascular: Warm, no edema Abdomen: Nondistended, bowel sounds present MSK: No synovitis, no joint effusion Neuro: Normal gait, no weakness Psych: Normal mood, full affect   Assessment & Plan:   Dyspnea on exertion: Suspect multifactorial.  She describes worsening after COVID infection.  In addition, worsening with metoprolol.  High suspicion for development of asthma given worsening symptoms with beta-blockade, chest tightness etc., as well as symptoms post-COVID, postviral activation of asthma.  In addition, she has fairly severe emphysema on CT scan.  Recommend bronchodilators to help with both, Stiolto 2 puffs once daily.  PFT 11/2022 confirms COPD, air trapping, hyperinflation.  Improved with Stiolto.  Stiolto refilled today.  Severe COPD: Based on pulmonary function test 11/2022.  Symptoms markedly improved with Stiolto.  Refilled today.  Tobacco abuse in remission: She is outside the window for lung cancer screening given last cigarette 20+ years ago.   Return in about 1 year (around 07/03/2024).   Karren Burly, MD 07/04/2023

## 2023-07-04 NOTE — Patient Instructions (Signed)
No changes in medication, continue Stiolto  Continue exercising as you are, this is helping you immensely I am sure  No samples today, feel free to check in the future if we have some if you need them  Return to clinic in 1 year or sooner as needed with Dr. Judeth Horn

## 2023-08-06 NOTE — Progress Notes (Unsigned)
 Cardiology Office Note:    Date:  08/07/2023   ID:  Evelyn [name], DOB 1950-08-29, MRN [id number]  PCP:  Tally Joe, MD  Cardiologist:  Little Ishikawa, MD  Electrophysiologist:  None   Referring MD: Tally Joe, MD   Chief Complaint  Patient presents with   Atrial Fibrillation    History of Present Illness:    Evelyn [name] is a 73 y.o. female with a hx of hyperlipidemia, paroxysmal atrial fibrillation who presents for follow-up.  She was referred by Dr. Nicholos Johns for evaluation of atrial fibrillation, initially seen 08/05/2021.  She was admitted from 3/3 through 07/24/2021 with chest pain.  Troponins negative.  She was found to have paroxysmal atrial fibrillation while admitted.  Echocardiogram 07/23/2021 showed normal biventricular function, no significant valvular disease.  Started on metoprolol and Eliquis.  Coronary CTA on 08/19/2021 showed nonobstructive CAD, calcium score 690 (96 percentile).  Zio patch x10 days on 08/25/2021 showed 1 episode of NSVT lasting 10 beats, 16 episodes of SVT with longest lasting 19 beats.  She reported worsening dyspnea and underwent exercise Myoview 07/2022, which showed poor exercise capacity (5.3 METS), normal perfusion, EF 73%.  Since last clinic visit, she reports she is doing well.  Reports dyspnea has improved since seeing pulmonology and treating her COPD.  Denies any chest pain, lightheadedness, syncope, lower extremity edema.  She reports palpitations occur every few months, can last for about a minute.  She monitors with her Kardia mobile device, has not noted any A-fib.  She reports BP has been normal when she checks at home.  She denies any bleeding on Eliquis.   Past Medical History:  Diagnosis Date   Allergy    seasonal   Cataract    bilateral removed   Hyperlipidemia    Osteopenia    Osteoporosis     Past Surgical History:  Procedure Laterality Date   ABDOMINAL HYSTERECTOMY     CARPAL TUNNEL RELEASE Bilateral     CATARACT EXTRACTION Bilateral    CESAREAN SECTION     x2   COLONOSCOPY     UTERINE FIBROID SURGERY  1999    Current Medications: Current Meds  Medication Sig   apixaban (ELIQUIS) 5 MG TABS tablet Take 1 tablet (5 mg total) by mouth 2 (two) times daily.   Calcium Carb-Cholecalciferol (CALCIUM 600+D3 PO) Take 1 tablet by mouth daily with breakfast.   famotidine (PEPCID) 40 MG tablet 1 tablet   ferrous sulfate 325 (65 FE) MG tablet Take 325 mg by mouth daily with breakfast.   fexofenadine (ALLEGRA) 180 MG tablet Take 180 mg by mouth daily as needed (for seasonal allergies).   omeprazole (PRILOSEC) 40 MG capsule Take 40 mg by mouth every morning.   raloxifene (EVISTA) 60 MG tablet Take 60 mg by mouth in the morning.   Tiotropium Bromide-Olodaterol (STIOLTO RESPIMAT) 2.5-2.5 MCG/ACT AERS Inhale 2 puffs into the lungs daily.     Allergies:   Ibuprofen   Social History   Socioeconomic History   Marital status: Married    Spouse name: Not on file   Number of children: Not on file   Years of education: Not on file   Highest education level: Not on file  Occupational History   Not on file  Tobacco Use   Smoking status: Former   Smokeless tobacco: Never  Vaping Use   Vaping status: Never Used  Substance and Sexual Activity   Alcohol use: Not Currently    Comment:  occa   Drug use: No   Sexual activity: Not on file  Other Topics Concern   Not on file  Social History Narrative   Not on file   Social Drivers of Health   Financial Resource Strain: Not on file  Food Insecurity: Not on file  Transportation Needs: Not on file  Physical Activity: Not on file  Stress: Not on file  Social Connections: Not on file     Family History: The patient's family history is negative for Colon cancer, Colon polyps, Esophageal cancer, Rectal cancer, and Stomach cancer.  ROS:   Please see the history of present illness.     All other systems reviewed and are negative.  EKGs/Labs/Other  Studies Reviewed:    The following studies were reviewed today:   EKG:   08/07/23: Normal sinus rhythm, rate 75, no ST abnormalities 02/01/2023: Normal sinus rhythm, rate 85, no ST abnormalities 08/01/22: Normal sinus rhythm, rate 71, no ST abnormalities 01/31/22: NSR, rate 67, no ST abnormalities 07/26/21: Normal sinus rhythm, rate 68, no ST abnormalities  Recent Labs: No results found for requested labs within last 365 days.  Recent Lipid Panel    Component Value Date/Time   CHOL 199 02/01/2023 0934   TRIG 162 (H) 02/01/2023 0934   HDL 75 02/01/2023 0934   CHOLHDL 2.7 02/01/2023 0934   CHOLHDL 2.0 07/24/2021 0427   VLDL 20 07/24/2021 0427   LDLCALC 97 02/01/2023 0934    Physical Exam:    VS:  BP (!) 144/64 (BP Location: Left Arm, Patient Position: Sitting, Cuff Size: Normal)   Pulse 75   Ht 5\' 1"  (1.549 m)   Wt 143 lb 6.4 oz (65 kg)   SpO2 96%   BMI 27.10 kg/m     Wt Readings from Last 3 Encounters:  08/07/23 143 lb 6.4 oz (65 kg)  07/04/23 144 lb (65.3 kg)  02/01/23 136 lb (61.7 kg)     GEN:  Well nourished, well developed in no acute distress HEENT: Normal NECK: No JVD; right carotid bruit LYMPHATICS: No lymphadenopathy CARDIAC: RRR, no murmurs, rubs, gallops RESPIRATORY:  Clear to auscultation without rales, wheezing or rhonchi  ABDOMEN: Soft, non-tender, non-distended MUSCULOSKELETAL:  No edema; No deformity  SKIN: Warm and dry NEUROLOGIC:  Alert and oriented x 3 PSYCHIATRIC:  Normal affect   ASSESSMENT:    1. Paroxysmal atrial fibrillation (HCC)   2. CAD in native artery   3. Hyperlipidemia, unspecified hyperlipidemia type   4. Bilateral carotid artery stenosis   5. Elevated BP without diagnosis of hypertension      PLAN:    CAD: Reported atypical chest pain.  Coronary CTA on 08/19/2021 showed nonobstructive CAD, calcium score 690 (96 percentile).  Echocardiogram 07/23/2021 showed normal biventricular function, no significant valvular disease.   She  reported worsening dyspnea and underwent exercise Myoview 07/2022, which showed poor exercise capacity (5.3 METS), normal perfusion, EF 73%.   -Continue rosuvastatin 10 mg daily -She is on Eliquis for atrial fibrillation, does not need aspirin -Suspect dyspnea is from COPD, referred to pulmonology. Reports dyspnea has improved since starting treatment.  Paroxysmal atrial fibrillation: CHA2DS2-VASc score 2 (age, female).  Echocardiogram 07/23/2021 showed normal biventricular function, no significant valvular disease.  Zio patch x10 days on 08/25/2021 showed 1 episode of NSVT lasting 10 beats, 16 episodes of SVT with longest lasting 19 beats, no atrial fibrillation -Continue Eliquis 5 mg twice daily -Did not tolerate metoprolol, reported feeling fatigued and short of breath which improved with  stopping metoprolol.  Will hold off on AV nodal blockers for now.  Recommend a Kardia mobile device to monitor for recurrent A-fib  Hyperlipidemia: On rosuvastatin 10 mg daily, LDL 94 on 08/01/2022.  Check lipid panel.  If LDL remains above 70, would increase rosuvastatin to 20 mg daily  Carotid stenosis: 50 to 69% stenosis in right ICA 07/2018.  Carotid duplex 01/2022 showed 40 to 59% right carotid stenosis, 1 to 39% left carotid stenosis.  Duplex 01/2023 showed bilateral 1 to 39% stenosis -Continue Eliquis, statin  Pulmonary nodule: 2 mm left lower lobe lung nodule seen on coronary CTA 08/19/2021.  Repeat CT chest 07/2022 showed unchanged pulmonary nodules, no follow-up needed.  However also noted to have moderate emphysema and given degree of emphysema, annual lung cancer screening CTs could be considered.  Referred to pulmonology  Elevated BP: BP mildly elevated in clinic today, no history of hypertension.  Asked to check BP twice daily for next week and let us know results  RTC in 6 months   Medication Adjustments/Labs and Tests Ordered: Current medicines are reviewed at length with the patient today.  Concerns  regarding medicines are outlined above.  Orders Placed This Encounter  Procedures   EKG 12-Lead   No orders of the defined types were placed in this encounter.   Patient Instructions  Medication Instructions:  NO changes *If you need a refill on your cardiac medications before your next appointment, please call your pharmacy*   Lab Work: CBC, BMET, LIPID PANEL today  If you have labs (blood work) drawn today and your tests are completely normal, you will receive your results only by: MyChart Message (if you have MyChart) OR A paper copy in the mail If you have any lab test that is abnormal or we need to change your treatment, we will call you to review the results.   Testing/Procedures: NONE  Follow-Up: At [facility], you and your health needs are our priority.  As part of our continuing mission to provide you with exceptional heart care, we have created designated Provider Care Teams.  These Care Teams include your primary Cardiologist (physician) and Advanced Practice Providers (APPs -  Physician Assistants and Nurse Practitioners) who all work together to provide you with the care you need, when you need it.  We recommend signing up for the patient portal called "MyChart".  Sign up information is provided on this After Visit Summary.  MyChart is used to connect with patients for Virtual Visits (Telemedicine).  Patients are able to view lab/test results, encounter notes, upcoming appointments, etc.  Non-urgent messages can be sent to your provider as well.   To learn more about what you can do with MyChart, go to [email or web address].    Your next appointment:   6 month(s)  Provider:   Little Ishikawa, MD     Other Instructions Blood Pressure Log   Date   Time  Blood Pressure  Position  Example: Nov 1 9 AM 124/78 sitting  Signed, Little Ishikawa, MD  08/07/2023 8:53 AM    [facility]

## 2023-08-07 ENCOUNTER — Ambulatory Visit: Payer: Medicare Other | Attending: Cardiology | Admitting: Cardiology

## 2023-08-07 ENCOUNTER — Encounter: Payer: Self-pay | Admitting: Cardiology

## 2023-08-07 VITALS — BP 144/64 | HR 75 | Ht 61.0 in | Wt 143.4 lb

## 2023-08-07 DIAGNOSIS — I6523 Occlusion and stenosis of bilateral carotid arteries: Secondary | ICD-10-CM | POA: Diagnosis not present

## 2023-08-07 DIAGNOSIS — I2510 Atherosclerotic heart disease of native coronary artery without angina pectoris: Secondary | ICD-10-CM

## 2023-08-07 DIAGNOSIS — I480 Paroxysmal atrial fibrillation: Secondary | ICD-10-CM | POA: Diagnosis not present

## 2023-08-07 DIAGNOSIS — E785 Hyperlipidemia, unspecified: Secondary | ICD-10-CM

## 2023-08-07 DIAGNOSIS — R030 Elevated blood-pressure reading, without diagnosis of hypertension: Secondary | ICD-10-CM | POA: Diagnosis not present

## 2023-08-07 DIAGNOSIS — Z79899 Other long term (current) drug therapy: Secondary | ICD-10-CM | POA: Diagnosis not present

## 2023-08-07 LAB — CBC

## 2023-08-07 MED ORDER — APIXABAN 5 MG PO TABS: 5.0000 mg | ORAL_TABLET | Freq: Two times a day (BID) | ORAL | 3 refills | Status: DC

## 2023-08-07 NOTE — Patient Instructions (Addendum)
 Medication Instructions:  NO changes *If you need a refill on your cardiac medications before your next appointment, please call your pharmacy*   Lab Work: CBC, BMET, LIPID PANEL today  If you have labs (blood work) drawn today and your tests are completely normal, you will receive your results only by: MyChart Message (if you have MyChart) OR A paper copy in the mail If you have any lab test that is abnormal or we need to change your treatment, we will call you to review the results.   Testing/Procedures: NONE  Follow-Up: At [facility], you and your health needs are our priority.  As part of our continuing mission to provide you with exceptional heart care, we have created designated Provider Care Teams.  These Care Teams include your primary Cardiologist (physician) and Advanced Practice Providers (APPs -  Physician Assistants and Nurse Practitioners) who all work together to provide you with the care you need, when you need it.  We recommend signing up for the patient portal called "MyChart".  Sign up information is provided on this After Visit Summary.  MyChart is used to connect with patients for Virtual Visits (Telemedicine).  Patients are able to view lab/test results, encounter notes, upcoming appointments, etc.  Non-urgent messages can be sent to your provider as well.   To learn more about what you can do with MyChart, go to [email or web address].    Your next appointment:   6 month(s)  Provider:   Little Ishikawa, MD     Other Instructions  Blood Pressure Log (2 weeks)   Date   Time  Blood Pressure  Position  Example: Nov 1 9 AM 124/78 sitting                                                                                                          Blood Pressure Log (2 weeks)   Date   Time  Blood Pressure  Position  Example: Nov 1 9 AM 124/78 sitting

## 2023-08-08 LAB — LIPID PANEL
Chol/HDL Ratio: 2.3 ratio (ref 0.0–4.4)
Cholesterol, Total: 171 mg/dL (ref 100–199)
HDL: 73 mg/dL (ref >39)
LDL Chol Calc (NIH): 73 mg/dL (ref 0–99)
Triglycerides: 150 mg/dL — ABNORMAL HIGH (ref 0–149)
VLDL Cholesterol Cal: 25 mg/dL (ref 5–40)

## 2023-08-08 LAB — CBC
Hematocrit: 37.6 % (ref 34.0–46.6)
Hemoglobin: 12.1 g/dL (ref 11.1–15.9)
MCH: 30.2 pg (ref 26.6–33.0)
MCHC: 32.2 g/dL (ref 31.5–35.7)
MCV: 94 fL (ref 79–97)
Platelets: 242 10*3/uL (ref 150–450)
RBC: 4.01 x10E6/uL (ref 3.77–5.28)
RDW: 12.1 % (ref 11.7–15.4)
WBC: 4.9 10*3/uL (ref 3.4–10.8)

## 2023-08-08 LAB — BASIC METABOLIC PANEL
BUN/Creatinine Ratio: 10 — ABNORMAL LOW (ref 12–28)
BUN: 8 mg/dL (ref 8–27)
CO2: 23 mmol/L (ref 20–29)
Calcium: 9.6 mg/dL (ref 8.7–10.3)
Chloride: 103 mmol/L (ref 96–106)
Creatinine, Ser: 0.83 mg/dL (ref 0.57–1.00)
Glucose: 83 mg/dL (ref 70–99)
Potassium: 4.4 mmol/L (ref 3.5–5.2)
Sodium: 139 mmol/L (ref 134–144)
eGFR: 76 mL/min/{1.73_m2} (ref >59)

## 2023-08-09 ENCOUNTER — Encounter: Payer: Self-pay | Admitting: *Deleted

## 2023-08-22 ENCOUNTER — Telehealth: Payer: Self-pay | Admitting: Cardiology

## 2023-08-22 NOTE — Telephone Encounter (Signed)
 Paper Work Dropped Off: BP LOG  Date: 08/22/2023  Location of paper:  Dr. Bjorn Pippin box

## 2023-08-23 ENCOUNTER — Encounter: Payer: Self-pay | Admitting: *Deleted

## 2023-08-24 NOTE — Telephone Encounter (Signed)
 Pt calling to advise that her mychart is not set up and wanted to know what the message was, pt advised of message sent via mychart

## 2023-10-08 DIAGNOSIS — J069 Acute upper respiratory infection, unspecified: Secondary | ICD-10-CM | POA: Diagnosis not present

## 2023-10-08 DIAGNOSIS — R509 Fever, unspecified: Secondary | ICD-10-CM | POA: Diagnosis not present

## 2023-10-08 DIAGNOSIS — U071 COVID-19: Secondary | ICD-10-CM | POA: Diagnosis not present

## 2023-10-08 DIAGNOSIS — R051 Acute cough: Secondary | ICD-10-CM | POA: Diagnosis not present

## 2023-11-14 DIAGNOSIS — E7800 Pure hypercholesterolemia, unspecified: Secondary | ICD-10-CM | POA: Diagnosis not present

## 2023-11-14 DIAGNOSIS — M810 Age-related osteoporosis without current pathological fracture: Secondary | ICD-10-CM | POA: Diagnosis not present

## 2023-11-17 DIAGNOSIS — K219 Gastro-esophageal reflux disease without esophagitis: Secondary | ICD-10-CM | POA: Diagnosis not present

## 2023-11-17 DIAGNOSIS — H6992 Unspecified Eustachian tube disorder, left ear: Secondary | ICD-10-CM | POA: Diagnosis not present

## 2023-11-17 DIAGNOSIS — E041 Nontoxic single thyroid nodule: Secondary | ICD-10-CM | POA: Diagnosis not present

## 2023-11-17 DIAGNOSIS — D649 Anemia, unspecified: Secondary | ICD-10-CM | POA: Diagnosis not present

## 2023-11-17 DIAGNOSIS — M25562 Pain in left knee: Secondary | ICD-10-CM | POA: Diagnosis not present

## 2023-11-17 DIAGNOSIS — E7800 Pure hypercholesterolemia, unspecified: Secondary | ICD-10-CM | POA: Diagnosis not present

## 2023-11-17 DIAGNOSIS — I480 Paroxysmal atrial fibrillation: Secondary | ICD-10-CM | POA: Diagnosis not present

## 2023-11-17 DIAGNOSIS — Z0000 Encounter for general adult medical examination without abnormal findings: Secondary | ICD-10-CM | POA: Diagnosis not present

## 2023-11-17 DIAGNOSIS — I6523 Occlusion and stenosis of bilateral carotid arteries: Secondary | ICD-10-CM | POA: Diagnosis not present

## 2023-11-17 DIAGNOSIS — J439 Emphysema, unspecified: Secondary | ICD-10-CM | POA: Diagnosis not present

## 2023-11-17 DIAGNOSIS — M85851 Other specified disorders of bone density and structure, right thigh: Secondary | ICD-10-CM | POA: Diagnosis not present

## 2023-12-04 ENCOUNTER — Other Ambulatory Visit: Payer: Self-pay | Admitting: Family Medicine

## 2023-12-04 DIAGNOSIS — Z1231 Encounter for screening mammogram for malignant neoplasm of breast: Secondary | ICD-10-CM

## 2023-12-05 DIAGNOSIS — M85851 Other specified disorders of bone density and structure, right thigh: Secondary | ICD-10-CM | POA: Diagnosis not present

## 2023-12-05 DIAGNOSIS — M8588 Other specified disorders of bone density and structure, other site: Secondary | ICD-10-CM | POA: Diagnosis not present

## 2023-12-14 DIAGNOSIS — E7800 Pure hypercholesterolemia, unspecified: Secondary | ICD-10-CM | POA: Diagnosis not present

## 2023-12-14 DIAGNOSIS — I480 Paroxysmal atrial fibrillation: Secondary | ICD-10-CM | POA: Diagnosis not present

## 2023-12-14 DIAGNOSIS — M810 Age-related osteoporosis without current pathological fracture: Secondary | ICD-10-CM | POA: Diagnosis not present

## 2024-01-01 DIAGNOSIS — M810 Age-related osteoporosis without current pathological fracture: Secondary | ICD-10-CM | POA: Diagnosis not present

## 2024-01-03 ENCOUNTER — Other Ambulatory Visit: Payer: Self-pay | Admitting: Cardiology

## 2024-01-16 ENCOUNTER — Ambulatory Visit
Admission: RE | Admit: 2024-01-16 | Discharge: 2024-01-16 | Disposition: A | Discharge status: Home / Self Care | Source: Ambulatory Visit | Attending: Family Medicine | Admitting: Family Medicine

## 2024-01-16 DIAGNOSIS — Z1231 Encounter for screening mammogram for malignant neoplasm of breast: Secondary | ICD-10-CM | POA: Diagnosis not present

## 2024-01-21 NOTE — Progress Notes (Unsigned)
 Cardiology Office Note:    Date:  01/23/2024   ID:  Evelyn [name], DOB 1951-01-29, MRN [id number]  PCP:  Seabron Lenis, MD  Cardiologist:  Lonni LITTIE Nanas, MD  Electrophysiologist:  None   Referring MD: Seabron Lenis, MD   Chief Complaint  Patient presents with   Atrial Fibrillation    History of Present Illness:    Evelyn [name] is a 73 y.o. female with a hx of hyperlipidemia, paroxysmal atrial fibrillation who presents for follow-up.  She was referred by Dr. Gib for evaluation of atrial fibrillation, initially seen 08/05/2021.  She was admitted from 3/3 through 07/24/2021 with chest pain.  Troponins negative.  She was found to have paroxysmal atrial fibrillation while admitted.  Echocardiogram 07/23/2021 showed normal biventricular function, no significant valvular disease.  Started on metoprolol  and Eliquis .  Coronary CTA on 08/19/2021 showed nonobstructive CAD, calcium  score 690 (96 percentile).  Zio patch x10 days on 08/25/2021 showed 1 episode of NSVT lasting 10 beats, 16 episodes of SVT with longest lasting 19 beats.  She reported worsening dyspnea and underwent exercise Myoview  07/2022, which showed poor exercise capacity (5.3 METS), normal perfusion, EF 73%.  Since last clinic visit, she reports she is doing well.  Denies any chest pain, dyspnea, lightheadedness, syncope, lower extremity edema, or palpitations.  She goes to gym 3 days/week for 2 to 2-1/2 hours each time.  Reports BP 120s to 130s at home.    Past Medical History:  Diagnosis Date   Allergy    seasonal   Cataract    bilateral removed   Hyperlipidemia    Osteopenia    Osteoporosis     Past Surgical History:  Procedure Laterality Date   ABDOMINAL HYSTERECTOMY     CARPAL TUNNEL RELEASE Bilateral    CATARACT EXTRACTION Bilateral    CESAREAN SECTION     x2   COLONOSCOPY     UTERINE FIBROID SURGERY  1999    Current Medications: Current Meds  Medication Sig   apixaban  (ELIQUIS ) 5 MG TABS  tablet Take 1 tablet (5 mg total) by mouth 2 (two) times daily.   Calcium  Carb-Cholecalciferol (CALCIUM  600+D3 PO) Take 1 tablet by mouth daily with breakfast.   famotidine (PEPCID) 40 MG tablet 1 tablet   ferrous sulfate 325 (65 FE) MG tablet Take 325 mg by mouth daily with breakfast.   fexofenadine (ALLEGRA) 180 MG tablet Take 180 mg by mouth daily as needed (for seasonal allergies).   omeprazole (PRILOSEC) 40 MG capsule Take 40 mg by mouth every morning.   raloxifene (EVISTA) 60 MG tablet Take 60 mg by mouth in the morning.   Tiotropium Bromide-Olodaterol (STIOLTO RESPIMAT ) 2.5-2.5 MCG/ACT AERS Inhale 2 puffs into the lungs daily.   [DISCONTINUED] rosuvastatin  (CRESTOR ) 20 MG tablet TAKE 1 TABLET BY MOUTH EVERY DAY     Allergies:   Ibuprofen   Social History   Socioeconomic History   Marital status: Married    Spouse name: Not on file   Number of children: Not on file   Years of education: Not on file   Highest education level: Not on file  Occupational History   Not on file  Tobacco Use   Smoking status: Former   Smokeless tobacco: Never  Vaping Use   Vaping status: Never Used  Substance and Sexual Activity   Alcohol use: Not Currently    Comment: occa   Drug use: No   Sexual activity: Not on file  Other Topics Concern  Not on file  Social History Narrative   Not on file   Social Drivers of Health   Financial Resource Strain: Not on file  Food Insecurity: Not on file  Transportation Needs: Not on file  Physical Activity: Not on file  Stress: Not on file  Social Connections: Not on file     Family History: The patient's family history is negative for Colon cancer, Colon polyps, Esophageal cancer, Rectal cancer, and Stomach cancer.  ROS:   Please see the history of present illness.     All other systems reviewed and are negative.  EKGs/Labs/Other Studies Reviewed:    The following studies were reviewed today:   EKG:   08/07/23: Normal sinus rhythm, rate  75, no ST abnormalities 02/01/2023: Normal sinus rhythm, rate 85, no ST abnormalities 08/01/22: Normal sinus rhythm, rate 71, no ST abnormalities 01/31/22: NSR, rate 67, no ST abnormalities 07/26/21: Normal sinus rhythm, rate 68, no ST abnormalities  Recent Labs: 08/07/2023: BUN 8; Creatinine, Ser 0.83; Hemoglobin 12.1; Platelets 242; Potassium 4.4; Sodium 139  Recent Lipid Panel    Component Value Date/Time   CHOL 171 08/07/2023 0912   TRIG 150 (H) 08/07/2023 0912   HDL 73 08/07/2023 0912   CHOLHDL 2.3 08/07/2023 0912   CHOLHDL 2.0 07/24/2021 0427   VLDL 20 07/24/2021 0427   LDLCALC 73 08/07/2023 0912    Physical Exam:    VS:  BP (!) 140/70 (BP Location: Left Arm, Patient Position: Sitting, Cuff Size: Normal)   Pulse 74   Ht 5' 1 (1.549 m)   Wt 136 lb 1.6 oz (61.7 kg)   SpO2 96%   BMI 25.72 kg/m     Wt Readings from Last 3 Encounters:  01/23/24 136 lb 1.6 oz (61.7 kg)  08/07/23 143 lb 6.4 oz (65 kg)  07/04/23 144 lb (65.3 kg)     GEN:  Well nourished, well developed in no acute distress HEENT: Normal NECK: No JVD; right carotid bruit CARDIAC: RRR, no murmurs, distant heart sounds RESPIRATORY:  Clear to auscultation without rales, wheezing or rhonchi  ABDOMEN: Soft, non-tender, non-distended MUSCULOSKELETAL:  No edema; No deformity  SKIN: Warm and dry NEUROLOGIC:  Alert and oriented x 3 PSYCHIATRIC:  Normal affect   ASSESSMENT:    1. CAD in native artery   2. Paroxysmal atrial fibrillation (HCC)   3. Hyperlipidemia, unspecified hyperlipidemia type   4. Elevated BP without diagnosis of hypertension      PLAN:    CAD: Reported atypical chest pain.  Coronary CTA on 08/19/2021 showed nonobstructive CAD, calcium  score 690 (96 percentile).  Echocardiogram 07/23/2021 showed normal biventricular function, no significant valvular disease.   She reported worsening dyspnea and underwent exercise Myoview  07/2022, which showed poor exercise capacity (5.3 METS), normal perfusion,  EF 73%.   -Continue rosuvastatin  10 mg daily -She is on Eliquis  for atrial fibrillation, does not need aspirin  -Suspect dyspnea is from COPD, referred to pulmonology. Reports dyspnea has resolved since starting treatment.  Paroxysmal atrial fibrillation: Noted during admission 07/2021.  CHA2DS2-VASc score 2 (age, female).  Echocardiogram 07/23/2021 showed normal biventricular function, no significant valvular disease.  Zio patch x10 days on 08/25/2021 showed 1 episode of NSVT lasting 10 beats, 16 episodes of SVT with longest lasting 19 beats, no atrial fibrillation -Continue Eliquis  5 mg twice daily -Did not tolerate metoprolol , reported feeling fatigued and short of breath which improved with stopping metoprolol .  Will hold off on AV nodal blockers for now.  She has a Optician, dispensing  mobile device to monitor for recurrent A-fib  Hyperlipidemia: On rosuvastatin  20 mg daily, LDL 73 on 08/07/23  Carotid stenosis: 50 to 69% stenosis in right ICA 07/2018.  Carotid duplex 01/2022 showed 40 to 59% right carotid stenosis, 1 to 39% left carotid stenosis.  Duplex 01/2023 showed bilateral 1 to 39% stenosis -Continue Eliquis , statin  COPD: 2 mm left lower lobe lung nodule seen on coronary CTA 08/19/2021.  Repeat CT chest 07/2022 showed unchanged pulmonary nodules, no follow-up needed.  However also noted to have moderate emphysema and given degree of emphysema, annual lung cancer screening CTs could be considered.  Referred to pulmonology PFTs showed severe COPD.  She was started on bronchodilators  Elevated BP: BP mildly elevated in clinic today, but reports under good control at home.  Will monitor  RTC in 6 months   Medication Adjustments/Labs and Tests Ordered: Current medicines are reviewed at length with the patient today.  Concerns regarding medicines are outlined above.  No orders of the defined types were placed in this encounter.  Meds ordered this encounter  Medications   rosuvastatin  (CRESTOR ) 20 MG tablet     Sig: Take 1 tablet (20 mg total) by mouth daily.    Dispense:  90 tablet    Refill:  3    Patient Instructions  Medication Instructions:  Continue current  medications *If you need a refill on your cardiac medications before your next appointment, please call your pharmacy*  Lab Work: none If you have labs (blood work) drawn today and your tests are completely normal, you will receive your results only by: MyChart Message (if you have MyChart) OR A paper copy in the mail If you have any lab test that is abnormal or we need to change your treatment, we will call you to review the results.  Testing/Procedures: none  Follow-Up: At [facility], you and your health needs are our priority.  As part of our continuing mission to provide you with exceptional heart care, our providers are all part of one team.  This team includes your primary Cardiologist (physician) and Advanced Practice Providers or APPs (Physician Assistants and Nurse Practitioners) who all work together to provide you with the care you need, when you need it.  Your next appointment:   6 months  Provider:   Dr. Kate  We recommend signing up for the patient portal called MyChart.  Sign up information is provided on this After Visit Summary.  MyChart is used to connect with patients for Virtual Visits (Telemedicine).  Patients are able to view lab/test results, encounter notes, upcoming appointments, etc.  Non-urgent messages can be sent to your provider as well.   To learn more about what you can do with MyChart, go to [email or web address].   Other Instructions none       Signed, Lonni LITTIE Kate, MD  01/23/2024 12:52 PM     [facility]

## 2024-01-23 ENCOUNTER — Ambulatory Visit: Attending: Cardiology | Admitting: Cardiology

## 2024-01-23 ENCOUNTER — Encounter: Payer: Self-pay | Admitting: Cardiology

## 2024-01-23 VITALS — BP 140/70 | HR 74 | Ht 61.0 in | Wt 136.1 lb

## 2024-01-23 DIAGNOSIS — E785 Hyperlipidemia, unspecified: Secondary | ICD-10-CM

## 2024-01-23 DIAGNOSIS — R030 Elevated blood-pressure reading, without diagnosis of hypertension: Secondary | ICD-10-CM

## 2024-01-23 DIAGNOSIS — I480 Paroxysmal atrial fibrillation: Secondary | ICD-10-CM

## 2024-01-23 DIAGNOSIS — I2510 Atherosclerotic heart disease of native coronary artery without angina pectoris: Secondary | ICD-10-CM | POA: Diagnosis not present

## 2024-01-23 MED ORDER — ROSUVASTATIN CALCIUM 20 MG PO TABS: 20.0000 mg | ORAL_TABLET | Freq: Every day | ORAL | 3 refills | Status: AC

## 2024-01-23 NOTE — Patient Instructions (Signed)
 Medication Instructions:  Continue current medications *If you need a refill on your cardiac medications before your next appointment, please call your pharmacy*  Lab Work: none If you have labs (blood work) drawn today and your tests are completely normal, you will receive your results only by: MyChart Message (if you have MyChart) OR A paper copy in the mail If you have any lab test that is abnormal or we need to change your treatment, we will call you to review the results.  Testing/Procedures: none  Follow-Up: At [facility], you and your health needs are our priority.  As part of our continuing mission to provide you with exceptional heart care, our providers are all part of one team.  This team includes your primary Cardiologist (physician) and Advanced Practice Providers or APPs (Physician Assistants and Nurse Practitioners) who all work together to provide you with the care you need, when you need it.  Your next appointment:   6 month(s)  Provider:   Dr. Alda Amas We recommend signing up for the patient portal called "MyChart".  Sign up information is provided on this After Visit Summary.  MyChart is used to connect with patients for Virtual Visits (Telemedicine).  Patients are able to view lab/test results, encounter notes, upcoming appointments, etc.  Non-urgent messages can be sent to your provider as well.   To learn more about what you can do with MyChart, go to [email or web address].   Other Instructions none

## 2024-06-03 ENCOUNTER — Telehealth: Payer: Self-pay | Admitting: Cardiology

## 2024-06-03 MED ORDER — APIXABAN 5 MG PO TABS: 5.0000 mg | ORAL_TABLET | Freq: Two times a day (BID) | ORAL | 1 refills | Status: AC

## 2024-06-03 NOTE — Telephone Encounter (Signed)
 Prescription refill request for Eliquis  received. Indication: AFIB Last office visit: 01/2024 Scr:  0.83  / 07/2023 Age: 74 Weight: 61.7KG  Eliquis  refill sent in for 90 days X's 1.

## 2024-06-03 NOTE — Telephone Encounter (Signed)
" °*  STAT* If patient is at the pharmacy, call can be transferred to refill team.   1. Which medications need to be refilled? (please list name of each medication and dose if known) apixaban  (ELIQUIS ) 5 MG TABS tablet    2. Would you like to learn more about the convenience, safety, & potential cost savings by using the [facility]? No    3. Are you open to using the [facility] (Type [facility]. ) No   4. Which pharmacy/location (including street and city if local pharmacy) is medication to be sent to?  [id number] - [place], [place] - [address]   5. Do they need a 30 day or 90 day supply? 90 day  "

## 2024-07-09 ENCOUNTER — Ambulatory Visit: Admitting: Pulmonary Disease

## 2024-09-02 ENCOUNTER — Ambulatory Visit: Admitting: Cardiology
